# Patient Record
Sex: Female | Born: 1940 | Race: White | Hispanic: No | Smoking: Never smoker
Health system: Southern US, Community
[De-identification: ages and names within clinical notes are randomized; demographics above are authoritative.]

## PROBLEM LIST (undated history)

## (undated) DIAGNOSIS — K429 Umbilical hernia without obstruction or gangrene: Secondary | ICD-10-CM

## (undated) DIAGNOSIS — M1990 Unspecified osteoarthritis, unspecified site: Secondary | ICD-10-CM

## (undated) DIAGNOSIS — T7840XA Allergy, unspecified, initial encounter: Secondary | ICD-10-CM

## (undated) DIAGNOSIS — K589 Irritable bowel syndrome without diarrhea: Secondary | ICD-10-CM

## (undated) DIAGNOSIS — K219 Gastro-esophageal reflux disease without esophagitis: Secondary | ICD-10-CM

## (undated) DIAGNOSIS — E785 Hyperlipidemia, unspecified: Secondary | ICD-10-CM

## (undated) DIAGNOSIS — Z9989 Dependence on other enabling machines and devices: Secondary | ICD-10-CM

## (undated) DIAGNOSIS — K5790 Diverticulosis of intestine, part unspecified, without perforation or abscess without bleeding: Secondary | ICD-10-CM

## (undated) DIAGNOSIS — D649 Anemia, unspecified: Secondary | ICD-10-CM

## (undated) DIAGNOSIS — G4733 Obstructive sleep apnea (adult) (pediatric): Secondary | ICD-10-CM

## (undated) DIAGNOSIS — I639 Cerebral infarction, unspecified: Secondary | ICD-10-CM

## (undated) DIAGNOSIS — E119 Type 2 diabetes mellitus without complications: Secondary | ICD-10-CM

## (undated) DIAGNOSIS — D369 Benign neoplasm, unspecified site: Secondary | ICD-10-CM

## (undated) DIAGNOSIS — N200 Calculus of kidney: Secondary | ICD-10-CM

## (undated) DIAGNOSIS — I10 Essential (primary) hypertension: Secondary | ICD-10-CM

## (undated) DIAGNOSIS — G4730 Sleep apnea, unspecified: Secondary | ICD-10-CM

## (undated) DIAGNOSIS — F419 Anxiety disorder, unspecified: Secondary | ICD-10-CM

## (undated) DIAGNOSIS — F329 Major depressive disorder, single episode, unspecified: Secondary | ICD-10-CM

## (undated) DIAGNOSIS — E669 Obesity, unspecified: Secondary | ICD-10-CM

## (undated) HISTORY — DX: Obesity, unspecified: E66.9

## (undated) HISTORY — DX: Benign neoplasm, unspecified site: D36.9

## (undated) HISTORY — DX: Umbilical hernia without obstruction or gangrene: K42.9

## (undated) HISTORY — DX: Unspecified osteoarthritis, unspecified site: M19.90

## (undated) HISTORY — DX: Anemia, unspecified: D64.9

## (undated) HISTORY — PX: CYSTOCELE REPAIR: SHX163

## (undated) HISTORY — DX: Gastro-esophageal reflux disease without esophagitis: K21.9

## (undated) HISTORY — PX: UMBILICAL HERNIA REPAIR: SHX196

## (undated) HISTORY — DX: Major depressive disorder, single episode, unspecified: F32.9

## (undated) HISTORY — PX: RECTOCELE REPAIR: SHX761

## (undated) HISTORY — PX: KNEE SURGERY: SHX244

## (undated) HISTORY — PX: TOTAL ABDOMINAL HYSTERECTOMY: SHX209

## (undated) HISTORY — DX: Calculus of kidney: N20.0

## (undated) HISTORY — DX: Essential (primary) hypertension: I10

## (undated) HISTORY — DX: Hyperlipidemia, unspecified: E78.5

## (undated) HISTORY — DX: Irritable bowel syndrome, unspecified: K58.9

## (undated) HISTORY — DX: Dependence on other enabling machines and devices: Z99.89

## (undated) HISTORY — DX: Sleep apnea, unspecified: G47.30

## (undated) HISTORY — PX: APPENDECTOMY: SHX54

## (undated) HISTORY — DX: Obstructive sleep apnea (adult) (pediatric): G47.33

## (undated) HISTORY — PX: OVARIAN CYST REMOVAL: SHX89

## (undated) HISTORY — PX: TONSILLECTOMY AND ADENOIDECTOMY: SUR1326

## (undated) HISTORY — PX: CHOLECYSTECTOMY: SHX55

## (undated) HISTORY — PX: ESOPHAGOGASTRODUODENOSCOPY: SHX1529

## (undated) HISTORY — DX: Cerebral infarction, unspecified: I63.9

## (undated) HISTORY — DX: Anxiety disorder, unspecified: F41.9

## (undated) HISTORY — DX: Type 2 diabetes mellitus without complications: E11.9

## (undated) HISTORY — DX: Allergy, unspecified, initial encounter: T78.40XA

## (undated) HISTORY — DX: Diverticulosis of intestine, part unspecified, without perforation or abscess without bleeding: K57.90

---

## 2002-08-04 ENCOUNTER — Encounter: Payer: Self-pay | Admitting: Internal Medicine

## 2002-08-04 ENCOUNTER — Encounter: Admission: RE | Admit: 2002-08-04 | Discharge: 2002-08-04 | Payer: Self-pay | Admitting: Internal Medicine

## 2005-03-12 ENCOUNTER — Ambulatory Visit (HOSPITAL_BASED_OUTPATIENT_CLINIC_OR_DEPARTMENT_OTHER): Admission: RE | Admit: 2005-03-12 | Discharge: 2005-03-12 | Payer: Self-pay | Admitting: Orthopedic Surgery

## 2005-03-12 ENCOUNTER — Ambulatory Visit (HOSPITAL_COMMUNITY): Admission: RE | Admit: 2005-03-12 | Discharge: 2005-03-12 | Payer: Self-pay | Admitting: Orthopedic Surgery

## 2005-04-01 ENCOUNTER — Ambulatory Visit (HOSPITAL_COMMUNITY): Admission: RE | Admit: 2005-04-01 | Discharge: 2005-04-01 | Payer: Self-pay | Admitting: Orthopedic Surgery

## 2005-11-18 ENCOUNTER — Inpatient Hospital Stay (HOSPITAL_COMMUNITY): Admission: AD | Admit: 2005-11-18 | Discharge: 2005-11-20 | Payer: Self-pay | Admitting: Cardiology

## 2005-11-18 ENCOUNTER — Ambulatory Visit: Payer: Self-pay | Admitting: Cardiology

## 2005-11-27 ENCOUNTER — Ambulatory Visit: Payer: Self-pay | Admitting: Cardiology

## 2005-12-03 ENCOUNTER — Ambulatory Visit: Payer: Self-pay | Admitting: Internal Medicine

## 2005-12-15 ENCOUNTER — Ambulatory Visit: Payer: Self-pay | Admitting: Cardiology

## 2006-03-09 ENCOUNTER — Ambulatory Visit: Payer: Self-pay | Admitting: Internal Medicine

## 2006-05-23 ENCOUNTER — Emergency Department (HOSPITAL_COMMUNITY): Admission: EM | Admit: 2006-05-23 | Discharge: 2006-05-23 | Payer: Self-pay | Admitting: Emergency Medicine

## 2007-07-15 ENCOUNTER — Emergency Department (HOSPITAL_COMMUNITY): Admission: EM | Admit: 2007-07-15 | Discharge: 2007-07-16 | Payer: Self-pay | Admitting: Emergency Medicine

## 2008-10-30 ENCOUNTER — Ambulatory Visit (HOSPITAL_COMMUNITY): Admission: RE | Admit: 2008-10-30 | Discharge: 2008-10-30 | Payer: Self-pay | Admitting: Family Medicine

## 2010-02-04 ENCOUNTER — Encounter (INDEPENDENT_AMBULATORY_CARE_PROVIDER_SITE_OTHER): Payer: Self-pay | Admitting: *Deleted

## 2010-02-28 ENCOUNTER — Telehealth: Payer: Self-pay | Admitting: Internal Medicine

## 2010-03-03 ENCOUNTER — Encounter (INDEPENDENT_AMBULATORY_CARE_PROVIDER_SITE_OTHER): Payer: Self-pay | Admitting: *Deleted

## 2010-03-05 ENCOUNTER — Ambulatory Visit: Payer: Self-pay | Admitting: Internal Medicine

## 2010-03-10 ENCOUNTER — Encounter: Payer: Self-pay | Admitting: Internal Medicine

## 2010-03-19 ENCOUNTER — Ambulatory Visit: Payer: Self-pay | Admitting: Internal Medicine

## 2010-03-20 ENCOUNTER — Encounter: Payer: Self-pay | Admitting: Internal Medicine

## 2010-06-17 NOTE — Letter (Signed)
Summary: Patient Notice- Polyp Results  [facility]  [address] [place], [place] [address]   Phone: [phone]  Fax: [phone]        March 20, 2010 MRN: [id number]    [facility] [address] [place], [place]  [address]    Dear [name],  I am pleased to inform you that the colon polyp(s) removed during your recent colonoscopy was (were) found to be benign (no cancer detected) upon pathologic examination.The polyps were tubular adenomas ( precancerous)  I recommend you have a repeat colonoscopy examination in _5 years to look for recurrent polyps, as having colon polyps increases your risk for having recurrent polyps or even colon cancer in the future.  Should you develop new or worsening symptoms of abdominal pain, bowel habit changes or bleeding from the rectum or bowels, please schedule an evaluation with either your primary care physician or with me.  Additional information/recommendations:  _x_ No further action with gastroenterology is needed at this time. Please      follow-up with your primary care physician for your other healthcare      needs.  __ Please call [phone] to schedule a return visit to review your      situation.  __ Please keep your follow-up visit as already scheduled.  __ Continue treatment plan as outlined the day of your exam.  Please call us if you are having persistent problems or have questions about your condition that have not been fully answered at this time.  Sincerely,  Hart Carwin MD  This letter has been electronically signed by your physician.  Appended Document: Patient Notice- Polyp Results letter mailed

## 2010-06-17 NOTE — Miscellaneous (Signed)
Summary: DIR COL...EM/previst  Clinical Lists Changes  Medications: Added new medication of MIRALAX   POWD (POLYETHYLENE GLYCOL 3350) As directed - Signed Added new medication of REGLAN 10 MG  TABS (METOCLOPRAMIDE HCL) As directed - Signed Added new medication of DULCOLAX 5 MG  TBEC (BISACODYL) As directed - Signed Rx of MIRALAX   POWD (POLYETHYLENE GLYCOL 3350) As directed;  #255 gms x 0;  Signed;  Entered by: Clide Cliff RN;  Authorized by: Hart Carwin MD;  Method used: Electronically to [facility]. [phone]*, [address] [place], [place], [place]  [address], Ph: [phone] or [phone], Fax: [phone] Rx of REGLAN 10 MG  TABS (METOCLOPRAMIDE HCL) As directed;  #2 x 0;  Signed;  Entered by: Clide Cliff RN;  Authorized by: Hart Carwin MD;  Method used: Electronically to [facility]. [phone]*, [address] [place], [place], [place]  [address], Ph: [phone] or [phone], Fax: [phone] Rx of DULCOLAX 5 MG  TBEC (BISACODYL) As directed;  #4 x 0;  Signed;  Entered by: Clide Cliff RN;  Authorized by: Hart Carwin MD;  Method used: Electronically to [facility]. [phone]*, [address], [place], [place], [place]  [address], Ph: [phone] or [phone], Fax: [phone]    Prescriptions: DULCOLAX 5 MG  TBEC (BISACODYL) As directed  #4 x 0   Entered by:   Clide Cliff RN   Authorized by:   Hart Carwin MD   Signed by:   Clide Cliff RN on 03/05/2010   Method used:   Electronically to        [facility]. [phone]* (retail)       [address]       [place], [place]  [address]       Ph: [phone] or [phone]       Fax: [phone]   RxID:   [id number] REGLAN 10 MG  TABS (METOCLOPRAMIDE HCL) As directed  #2 x 0   Entered by:   Clide Cliff RN   Authorized by:   Hart Carwin MD   Signed by:   Clide Cliff RN on 03/05/2010   Method used:    Electronically to        [facility]. [phone]* (retail)       [address]       [place], [place]  [address]       Ph: [phone] or [phone]       Fax: [phone]   RxID:   [id number] MIRALAX   POWD (POLYETHYLENE GLYCOL 3350) As directed  #255 gms x 0   Entered by:   Clide Cliff RN   Authorized by:   Hart Carwin MD   Signed by:   Clide Cliff RN on 03/05/2010   Method used:   Electronically to        [facility]. [phone]* (retail)       [address]       [place], [place]  [address]       Ph: [phone] or [phone]       Fax: [phone]   RxID:   [id number]

## 2010-06-17 NOTE — Letter (Signed)
Summary: Pre Visit Letter Revised  [facility]  [address] [place], [place] [address]   Phone: [phone]  Fax: [phone]        02/04/2010 MRN: [id number]  [facility] [address] [place], [place]  [address]             Procedure Date:  March 19, 2010   Welcome to the Gastroenterology Division at [facility].    You are scheduled to see a nurse for your pre-procedure visit on March 05, 2010 at 10:00am on the 3rd floor at [facility], [address]  We ask that you try to arrive at our office 15 minutes prior to your appointment time to allow for check-in.  Please take a minute to review the attached form.  If you answer "Yes" to one or more of the questions on the first page, we ask that you call the person listed at your earliest opportunity.  If you answer "No" to all of the questions, please complete the rest of the form and bring it to your appointment.    Your nurse visit will consist of discussing your medical and surgical history, your immediate family medical history, and your medications.   If you are unable to list all of your medications on the form, please bring the medication bottles to your appointment and we will list them.  We will need to be aware of both prescribed and over the counter drugs.  We will need to know exact dosage information as well.    Please be prepared to read and sign documents such as consent forms, a financial agreement, and acknowledgement forms.  If necessary, and with your consent, a friend or relative is welcome to sit-in on the nurse visit with you.  Please bring your insurance card so that we may make a copy of it.  If your insurance requires a referral to see a specialist, please bring your referral form from your primary care physician.  No co-pay is required for this nurse visit.     If you cannot keep your appointment, please call [phone] to cancel or reschedule prior to your appointment date.   This allows [place] the opportunity to schedule an appointment for another patient in need of care.    Thank you for choosing  Gastroenterology for your medical needs.  We appreciate the opportunity to care for you.  Please visit [place] at our website  to learn more about our practice.  Sincerely, The Gastroenterology Division

## 2010-06-17 NOTE — Letter (Signed)
Summary: [facility] Instructions  [facility]  [address] [place], [place] [address]   Phone: [phone]  Fax: [phone]       Wendy Fitzpatrick    1941-04-24    MRN: [id number]       Procedure Day Dorna Bloom:  Wednesday 03/19/2010     Arrival Time: 9:30 am     Procedure Time: 10:30 am     Location of Procedure:                    _x _  [facility] (4th Floor)    PREPARATION FOR COLONOSCOPY WITH MIRALAX  Starting 5 days prior to your procedure Friday 10/28 do not eat nuts, seeds, popcorn, corn, beans, peas,  salads, or any raw vegetables.  Do not take any fiber supplements (e.g. Metamucil, Citrucel, and Benefiber). ____________________________________________________________________________________________________   THE DAY BEFORE YOUR PROCEDURE         DATE: Tuesday 11/1  1   Drink clear liquids the entire day-NO SOLID FOOD  2   Do not drink anything colored red or purple.  Avoid juices with pulp.  No orange juice.  3   Drink at least 64 oz. (8 glasses) of fluid/clear liquids during the day to prevent dehydration and help the prep work efficiently.  CLEAR LIQUIDS INCLUDE: Water Jello Ice Popsicles Tea (sugar ok, no milk/cream) Powdered fruit flavored drinks Coffee (sugar ok, no milk/cream) Gatorade Juice: apple, white grape, white cranberry  Lemonade Clear bullion, consomm, broth Carbonated beverages (any kind) Strained chicken noodle soup Hard Candy  4   Mix the entire bottle of Miralax with 64 oz. of Gatorade/Powerade in the morning and put in the refrigerator to chill.  5   At 3:00 pm take 2 Dulcolax/Bisacodyl tablets.  6   At 4:30 pm take one Reglan/Metoclopramide tablet.  7  Starting at 5:00 pm drink one 8 oz glass of the Miralax mixture every 15-20 minutes until you have finished drinking the entire 64 oz.  You should finish drinking prep around 7:30 or 8:00 pm.  8   If you are nauseated, you may take the 2nd Reglan/Metoclopramide  tablet at 6:30 pm.        9    At 8:00 pm take 2 more DULCOLAX/Bisacodyl tablets.     THE DAY OF YOUR PROCEDURE      DATE:  Wednesday 11/2  You may drink clear liquids until 8:30 am  (2 HOURS BEFORE PROCEDURE).   MEDICATION INSTRUCTIONS  Unless otherwise instructed, you should take regular prescription medications with a small sip of water as early as possible the morning of your procedure.           OTHER INSTRUCTIONS  You will need a responsible adult at least 68 years of age to accompany you and drive you home.   This person must remain in the waiting room during your procedure.  Wear loose fitting clothing that is easily removed.  Leave jewelry and other valuables at home.  However, you may wish to bring a book to read or an iPod/MP3 player to listen to music as you wait for your procedure to start.  Remove all body piercing jewelry and leave at home.  Total time from sign-in until discharge is approximately 2-3 hours.  You should go home directly after your procedure and rest.  You can resume normal activities the day after your procedure.  The day of your procedure you should not:   Drive  Make legal decisions   Operate machinery   Drink alcohol   Return to work  You will receive specific instructions about eating, activities and medications before you leave.   The above instructions have been reviewed and explained to me by   Clide Cliff, RN______________________    I fully understand and can verbalize these instructions _____________________________ Date _______

## 2010-06-17 NOTE — Progress Notes (Signed)
Summary: Speak to Wendy Fitzpatrick  Phone Note Call from Patient Call back at [phone] cell   Call For: Dr Juanda Chance Reason for Call: Talk to Nurse Summary of Call: Wants to discuss the Assesment form we mailed her one of the questions is a yes. Initial call taken by: Leanor Kail [facility],  February 28, 2010 1:34 PM  Follow-up for Phone Call        Patient states that she had a colonoscopy in [place] around 12 years ago. I have sent a release of information form for patient to sign so we can at least attempt to get those records. Follow-up by: Lamona Curl CMA Duncan Dull),  February 28, 2010 2:51 PM

## 2010-06-17 NOTE — Procedures (Signed)
Summary: Colonoscopy  Patient: [name] Note: All result statuses are Final unless otherwise noted.  Tests: (1) Colonoscopy (COL)   COL Colonoscopy           DONE     [facility]     [address]     [place], [place]  [address]           COLONOSCOPY PROCEDURE REPORT           PATIENT:  Wendy Fitzpatrick, Wendy Fitzpatrick  MR#:  [id number]     BIRTHDATE:  1940-08-26, 69 yrs. old  GENDER:  female     ENDOSCOPIST:  Hedwig Morton. Juanda Chance, MD     REF. BY:  Kari Baars, M.D.     PROCEDURE DATE:  03/19/2010     PROCEDURE:  Colonoscopy [address]     ASA CLASS:  Class II     INDICATIONS:  Elevated Risk Screening father with colon cancer     MEDICATIONS:   Versed 5 mg, Fentanyl 25 mcg           DESCRIPTION OF PROCEDURE:   After the risks benefits and     alternatives of the procedure were thoroughly explained, informed     consent was obtained.  Digital rectal exam was performed and     revealed no rectal masses.   The LB [id number]-[id number] endoscope     was introduced through the anus and advanced to the cecum, which     was identified by both the appendix and ileocecal valve, without     limitations.  The quality of the prep was adequate, using MiraLax.     The instrument was then slowly withdrawn as the colon was fully     examined.     <<PROCEDUREIMAGES>>           FINDINGS:  Two polyps were found. at 50 and 55 cm 1-2 mm polyps     removed The polyps were removed using cold biopsy forceps (see     image7, image6, and image9).  Mild diverticulosis was found in the     sigmoid colon (see image10 and image1).  This was otherwise a     normal examination of the colon (see image2, image3, image4,     image5, and image11).   Retroflexed views in the rectum revealed     no abnormalities.    The scope was then withdrawn from the patient     and the procedure completed.           COMPLICATIONS:  None     ENDOSCOPIC IMPRESSION:     1) Two polyps     2) Mild diverticulosis in the sigmoid colon     3)  Otherwise normal examination     RECOMMENDATIONS:     1) Await pathology results     2) High fiber diet.     REPEAT EXAM:  In 5 - 7 year(s) for.           ______________________________     Hedwig Morton. Juanda Chance, MD           CC:           n.     eSIGNED:   Hedwig Morton. Brodie at 03/19/2010 11:21 AM           Neta Mends, [id number]  Note: An exclamation mark (!) indicates a result that was not dispersed into the flowsheet. Document Creation Date: 03/19/2010 11:22 AM _______________________________________________________________________  (1) Order  result status: Final Collection or observation date-time: 03/19/2010 11:16 Requested date-time:  Receipt date-time:  Reported date-time:  Referring Physician:   Ordering Physician: Lina Sar [phone]) Specimen Source:  Source: Launa Grill Order Number: [id number] Lab site:   Appended Document: Colonoscopy     Procedures Next Due Date:    Colonoscopy: 03/2015

## 2010-06-17 NOTE — Miscellaneous (Signed)
Summary: Previous Colonoscopy  Patient advised [place] that she had a colonoscopy 12 years ago in [place]. I sent her a records release to sign and send back to our office. Unfortunately, she states that she is unable to recall where in [place] her procedure was. Therefore, there are no available records from patient's previous colonoscopy. Dottie Nelson-Smith CMA Duncan Dull)  March 10, 2010 9:47 AM

## 2010-08-11 ENCOUNTER — Encounter: Payer: Self-pay | Admitting: Internal Medicine

## 2010-08-19 NOTE — Letter (Signed)
Summary: New Patient letter  [facility]  [address] [place], [place] [address]   Phone: [phone]  Fax: [phone]       08/11/2010 MRN: [id number]  [facility] [address] [place], [place]  [address]  Dear [name],  Welcome to the Gastroenterology Division at [facility].    You are scheduled to see Dr.  Juanda Chance on 10-07-10 at 10:30am on the 3rd floor at [facility], [address].  We ask that you try to arrive at our office 15 minutes prior to your appointment time to allow for check-in.  We would like you to complete the enclosed self-administered evaluation form prior to your visit and bring it with you on the day of your appointment.  We will review it with you.  Also, please bring a complete list of all your medications or, if you prefer, bring the medication bottles and we will list them.  Please bring your insurance card so that we may make a copy of it.  If your insurance requires a referral to see a specialist, please bring your referral form from your primary care physician.  Co-payments are due at the time of your visit and may be paid by cash, check or credit card.     Your office visit will consist of a consult with your physician (includes a physical exam), any laboratory testing he/she may order, scheduling of any necessary diagnostic testing (e.g. x-ray, ultrasound, CT-scan), and scheduling of a procedure (e.g. Endoscopy, Colonoscopy) if required.  Please allow enough time on your schedule to allow for any/all of these possibilities.    If you cannot keep your appointment, please call [phone] to cancel or reschedule prior to your appointment date.  This allows [place] the opportunity to schedule an appointment for another patient in need of care.  If you do not cancel or reschedule by 5 p.m. the business day prior to your appointment date, you will be charged a $50.00 late cancellation/no-show fee.    Thank you for choosing [facility] for your medical needs.  We appreciate the opportunity to care for you.  Please visit [place] at our website  to learn more about our practice.                     Sincerely,                                                             The Gastroenterology Division

## 2010-10-03 NOTE — Cardiovascular Report (Signed)
NAME:  Wendy Fitzpatrick, Wendy Fitzpatrick                  ACCOUNT NO.:  [id number]   MEDICAL RECORD NO.:  [id number]          PATIENT TYPE:  INP   LOCATION:  2920                         FACILITY:  [facility]   PHYSICIAN:  Arvilla Meres, M.D. [facility] OF BIRTH:  1940/07/29   DATE OF PROCEDURE:  11/19/2005  DATE OF DISCHARGE:                              CARDIAC CATHETERIZATION   PRIMARY CARE PHYSICIAN:  Dr. Ruffin Pyo   CARDIOLOGIST:  She is new to [facility].   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  She  has had known vasovagal syncope in the past.  Recently she has had three  episodes of syncope as well as some chest discomfort.  She underwent  Cardiolite scanning with at [facility] with a question of an apical  defect and she is sent for cardiac catheterization.   PROCEDURES PERFORMED:  1.  Selective coronary angiography.  2.  Left heart catheterization.  3.  Left ventriculogram.  4.  Abdominal aortogram.  5.  Angio-Seal closure.   DESCRIPTION OF PROCEDURE:  The risks and benefits of catheterization were  explained.  Consent was signed and placed on the chart.  Under 1% local  lidocaine a 5-French arterial sheath was placed in the right femoral artery  using a modified Seldinger technique.  Standard catheters including a JL-  3.5, JR-4, and angled pigtail were used for procedure.  At the end of the  procedure the patient's right femoral arteriotomy site was closed using  Angio-Seal closure device.  There was good hemostasis.  The are no apparent  complications.  Central aortic pressure was 164/85 with a mean of 117.  LV  pressure was 192/80 with an LVEDP of 24.  There is no aortic stenosis.   Left main was angiographically normal.   LAD was a long vessel coursing to the apex.  It gave off two small diagonals  and was angiographically normal.   Left circumflex was a large  dominant vessel.  It gave off a small ramus, a  large OM-1, a small OM-2, a moderate-size OM-3, moderate-size posterolateral  and a moderate-size PDA.  It was angiographically normal.   The right coronary was a small nondominant vessel.   Left ventriculogram done in the RAO projection showed an EF of 65-70% with  trace mitral regurgitation.  No wall motion abnormalities.   ASSESSMENT:  1.  Normal coronary arteries.  2.  Normal left ventricular function with mildly elevated end diastolic      pressure, consistent with diastolic dysfunction.  3.  Poorly controlled hypertension.   Plan will be aggressive control of her blood pressure, trial of proton pump  inhibitor for her chest pain.  She can go home in the morning if her groin  remains stable.      Arvilla Meres, M.D. [facility]  Electronically Signed     DB/MEDQ  D:  11/19/2005  T:  11/19/2005  Job:  [id number]

## 2010-10-03 NOTE — Discharge Summary (Signed)
NAME:  Wendy Fitzpatrick, Wendy Fitzpatrick                  ACCOUNT NO.:  [id number]   MEDICAL RECORD NO.:  [id number]          PATIENT TYPE:  INP   LOCATION:  2920                         FACILITY:  [facility]   PHYSICIAN:  Jesse Sans. Wall, M.D.   DATE OF BIRTH:  07-Jul-1940   DATE OF ADMISSION:  11/18/2005  DATE OF DISCHARGE:  11/20/2005                                 DISCHARGE SUMMARY   REASON FOR ADMISSION:  Chest pain worrisome for unstable angina pectoris.   DISCHARGE DIAGNOSES:  1.  Noncardiac chest pain.      1.  Normal coronary arteries per catheterization this admission.  2.  Good left ventricular function.  3.  History of vasovagal syncope.      1.  MRI/MRA of the brain normal this admission.      2.  No significant internal carotid artery stenosis noted on a carotid          Doppler done at [facility].      3.  Good left ventricular function on echocardiogram performed at          [facility].  4.  Hypertension.  5.  Gastroesophageal reflux disease.  6.  Mild dyslipidemia.  7.  Degenerative joint disease.  8.  Obesity.  9.  Migraine headaches.  10. Depression/anxiety.  11. Allergic rhinitis.  12. Mild orthostasis.   PROCEDURE PERFORMED THIS ADMISSION:  Cardiac catheterization by Dr. Arvilla Meres, revealing normal coronary arteries, good LV function.   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  She ruled out for myocardial infarction,  but underwent a nuclear study that was concerning for apical ischemia.  She  was transferred for further evaluation.  Her history is notable for an  episode that was worrisome for a transient ischemic attack.  This was  witnessed by her daughter, who is a family physician.  The patient underwent  carotid Dopplers, as well as an echocardiogram, at [facility].  The  results were negative as noted above.   The patient also has a history of  vasovagal syncope in the past.  She has apparently been evaluated.  She has  been having symptoms since she was 70 years old.  She did have an episode of  syncope while she was getting into the ambulance and preparing for transfer  to [facility].   HOSPITAL COURSE:  The patient was accepted in transferred at [facility].  She was continued on aspirin, Lovenox, beta blocker, and  nitroglycerin, and a statin was added empirically.  She was set up for a  cardiac catheterization November 19, 2005.  Cardiac catheterization was done by  Dr. Gala Romney and revealed normal coronary arteries and good LV function and  trace mitral regurgitation.  The EF was 65-70%.  It was recommended that she  begin a trial of proton pump inhibitor.   Given the patient's history of vasovagal syncope and a recent episode  concerning  for transient ischemic attack, we set her up for an MRI/MRA of  her brain.  This returned normal.  As noted above, her carotid Dopplers were  also negative for hemodynamically significant internal carotid artery  stenosis.  The patient underwent orthostatic vital signs prior to discharge.  Her blood pressure, lying, was 124/70 with a pulse of 73, sitting 126/65  with a pulse of 76, standing 111/74 with a pulse of 97.   The patient was seen by Dr. Daleen Squibb on November 20, 2005, and she was felt stable  enough for discharge to home.  Given her history of vasovagal syncope, we  can consider outpatient workup.  This can be discussed when she returns to  the office for followup.  She apparently had a tilt table test about 15  years ago.  Given her mild orthostasis as evidenced by her increased heart  rate with standing, she will be asked to increase her p.o. fluids.  We can  recheck this when she returns to the office for followup.   LABS AND ANCILLARY DATA:  Echocardiogram from [facility] revealed a  minimally sclerotic aortic valve, mild  tricuspid regurgitation without  pulmonary hypertension, normal LV systolic performance.  Carotid Dopplers  revealed no evidence of hemodynamically significant stenosis involving the  carotid circulation bilaterally.  There was some mild intimal thickening and  small soft and calcified plaque at the origin of the right ICA and mild  intimal thickening involving the common carotid artery on the left with a  small soft and calcified plaque at the origin of the left external carotid  artery.  MRI/MRA of the brain:  Negative MRI of the brain.  No evidence of  acute or subacute infarction.  Negative MRA of the circle of Willis.  White  count 6100, hemoglobin 12.9, hematocrit 38.1, platelet count 177,000.  INR  1.0.  Sodium 141, potassium 4.1, glucose 98, BUN 11, creatinine 0.8, calcium  8.9.  LFTs okay.  Total protein 5.4, albumin 3.3.  Cardiac markers negative  x2.  TSH 2.604.   DISCHARGE MEDICATIONS:  1.  Wellbutrin 300 mg daily.  2.  Lexapro 20 mg daily.  3.  Atenolol 25 mg daily.  4.  Lotensin 10 mg daily.  5.  Protonix 40 mg daily.  6.  Fioricet.  7.  Glucosamine.  8.  Vitamin D.  9.  Coated aspirin 81 mg daily.   DIET:  Low-fat, low-sodium, low-cholesterol diet.  She has been asked to  increase her fluids to help with her mild orthostasis.  She has been  instructed on hygiene from changing from lying to sitting to standing to  prevent dizziness.   ACTIVITY:  No driving, heavy lifting, or sexual activity for the next 2  days.  She may shower and walk up steps, and she is to increase her activity  slowly.   WOUND CARE:  She should call our office for any groin swelling, bleeding or  bruising, or fever.   FOLLOWUP:  1.  She will see the PA at [facility] for Dr. Gala Romney on December 15, 2005, at 9:45 a.m.  At that time, it can be discussed with the patient     regarding outpatient workup for her vasovagal syncope.  We should also      recheck her orthostatic vital  signs.  2.  She should see her primary care physician, Dr. Simone Curia, in the next 2-      3 weeks  for a followup appointment.  She can undergo a trial of      lifestyle modifications to assist her lipid panel.  She can further      discuss the initiation of a statin with her primary care physician      should she not attain an LDL level below 100.      Tereso Newcomer, P.A.      Thomas C. Wall, M.D.  Electronically Signed    SW/MEDQ  D:  11/20/2005  T:  11/20/2005  Job:  [id number]   cc:   Simone Curia

## 2010-10-03 NOTE — Assessment & Plan Note (Signed)
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. [employer] who has a  history of hypertension and hyperlipidemia and recently had a normal cardiac  catheterization.  She continues to have problems with blood pressure,  palpitations and dizziness.  She has an appointment to see a neurologist for  possible TIA work-up.  She was seen by Dr. Antoine Poche here in the office two  weeks ago and had 24-hour urines for catecholamines, metanephrines and VMA  to rule out pheochromocytoma, all of which were normal.  She does have an  event recorder that she is wearing and she has had several episodes of  palpitations and sinus tachycardia up to 126 beats per minute.  These all  occurred when her blood pressure was elevated and she did not necessarily  take her medications.  Her most significant episode was when she was cooking  dinner for her husband's birthday, she had forgotten to take her medicine  and she felt dizzy and her heart started racing.  When she took her blood  pressure, it was 172/92.  As soon as she took her medications, she felt  better.  Since the benazepril has been stopped, she is feeling much better  overall.  This was causing her blood pressure to dip as low as 85/40.   CURRENT MEDICATIONS:  1.  Atenolol 25 daily.  2.  Protonix 40 daily.  3.  Lipitor 40 nightly.  4.  Lexapro 20 daily.  5.  Wellbutrin 300 daily.  6.  Glucosamine chondroitan.  7.  Vitamin D and calcium daily.  8.  Aspirin 81 mg daily.  9.  Topamax 100 mg nightly.   PHYSICAL EXAMINATION:  GENERAL APPEARANCE:  This is a pleasant 70 year old  white female in no acute distress.  VITAL SIGNS:  Blood pressure 128/80, pulse 82, weight 201.  NECK:  Without JVD, HJR, bruit  or thyroid enlargement.  LUNGS:  Clear anterior, posterior and lateral.  CARDIOVASCULAR:  Regular rate and rhythm at 82 beats per minute.  Normal S1  and S2.  Distant heart sounds are heard throughout.  ABDOMEN:  Soft without organomegaly, masses, lesions or abnormal tenderness.  EXTREMITIES:  Without clubbing, cyanosis, or edema.  She has good distal  pulses.   IMPRESSION:  1.  Hypertension.  Needs better 24-hour control.  2.  Palpitations and sinus tachycardia related to her hypertension.  3.  Normal coronary arteries on catheterization in July 2007 with normal      left ventricular function.  4.  Question of transient ischemic attack for neurology work-up.  5.  Treated dyslipidemia.  6.  Anxiety and depression.  7.  Gastroesophageal reflux disease.  8.  Obesity.  9.  History of migraine headaches.   PLAN:  Will increase her Atenolol to 50 mg a day.  We  have asked her to take  this consistently at the same time every morning to avoid high fluctuations  in her blood pressure.  Dr. Gala Romney talked to her about stress reduction  and decreased caffeine intake.  He will see her back in three months' time.                                  Jacolyn Reedy, PA-C    ML/MedQ  DD:  12/15/2005  DT:  12/15/2005  Job #:  [id number]

## 2010-10-03 NOTE — Discharge Summary (Signed)
NAME:  Wendy Fitzpatrick, Wendy Fitzpatrick                  ACCOUNT NO.:  [id number]   MEDICAL RECORD NO.:  [id number]          PATIENT TYPE:  INP   LOCATION:  2920                         FACILITY:  [facility]   PHYSICIAN:  Jesse Sans. Wall, M.D.   DATE OF BIRTH:  [birth year]   DATE OF ADMISSION:  11/18/2005  DATE OF DISCHARGE:                                 DISCHARGE SUMMARY   ADDENDUM:  Total physician/PA time greater than 30 minutes on this  discharge.      Tereso Newcomer, P.A.      Thomas C. Wall, M.D.  Electronically Signed    SW/MEDQ  D:  11/20/2005  T:  11/20/2005  Job:  [id number]

## 2010-10-03 NOTE — Assessment & Plan Note (Signed)
HEALTHCARE                              CARDIOLOGY OFFICE NOTE   NAME:Fitzpatrick, Wendy                           MRN:          [id number]  DATE:03/09/2006                            DOB:          November 06, 1940    PRIMARY CARE PHYSICIAN:  Dr. 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   PROBLEM LIST:  1. Hypertension.      a.     Work up for pheochromocytoma was negative.  2. Palpitations and sinus tachycardia related to her hypertension.  3. Chest pain.      a.     Normal coronary arteries on catheterization in July 2007, with       normal LV function. Questionable history of transient ischemic attack;       workup per neurology, MRI/MRA and carotid Dopplers negative.  4. Obesity.  5. Hyperlipidemia.  6. Anxiety and depression.  7. Gastroesophageal reflux disease.   CURRENT MEDICATIONS:  1. Atenolol 50 a day.  2. Protonix 40 a day.  3. Lipitor 40 a day.  4. Lexapro 20.  5. Wellbutrin 300.  6. Glucosamine.  7. Aspirin 81.  8. Topamax 1000.  9. Lisinopril 20 a day.   INTERVAL HISTORY:  Wendy Fitzpatrick recently recovered from stomach flu. She now  feels better. She denies any chest pains. She says palpitations have been  much better. She denies any heart failure symptoms. She does feel  significant sense of anxiety, especially as her husband has early onset  dementia.   REVIEW OF SYSTEMS:  She also tells me that she has frequent snoring, but  does not endorse significant nighttime sleepiness as she does work night  shift.   PHYSICAL EXAMINATION:  She is well-appearing in no acute distress.  Respirations are unlabored. Blood pressure is 134/76. Heart rate is 53.  Weight is 194.  HEENT: Sclerae anicteric. EOMI. There is no xanthelasma. Mucous membranes  are moist.  Neck is supple. There is no JVD. Neck is thick. Carotids are 2+ bilaterally  without any bruits. There is no lymphadenopathy or thyromegaly.  CARDIAC: She is bradycardic and regular. No obvious murmurs, rubs or  gallops.  LUNGS:  Are clear.  ABDOMEN: Obese, nontender, nondistended. No hepatosplenomegaly. No bruits.  No masses.  EXTREMITIES: Are warm with no cyanosis, clubbing or edema. Good distal  pulses.  NEURO: She is alert and oriented x3. Cranial nerves II-XII are intact. Moves  all four extremities without difficulty. Affect is bright.   EKG shows sinus bradycardia at a rate of 53 with normal axis and intervals.   ASSESSMENT/PLAN:  1. Hypertension, this is suboptimally controlled. She was recently started      on lisinopril. We will check a BMET today. We will also switch her      atenolol over to Coreg 6.25 b.i.d. as we will get a significant      component of alpha blockade in addition to beta blockade.  2. Hyperlipidemia, this is followed by  Dr. Nedra Hai.  3. Questionable history of sleep apnea. We will check an apnea link.  4. Anxiety. I have given her a prescription for a one month supply of      Klonopin 0.25 a day and I have asked her to follow up with Dr. Nedra Hai for      further evaluation of this.     Bevelyn Buckles. Bensimhon, MD    DRB/MedQ  DD: 03/09/2006  DT: 03/09/2006  Job #: [id number]   cc:   Dr. Simone Curia

## 2010-10-03 NOTE — Assessment & Plan Note (Signed)
[facility]                              CARDIOLOGY OFFICE NOTE   NAME:Wendy Fitzpatrick, Wendy Fitzpatrick                           MRN:          [id number]  DATE:11/27/2005                            DOB:          [birth year]    SUBJECTIVE:  Wendy Fitzpatrick is a very pleasant 70 year old female patient who  presents to the office today for post hospitalization follow-up.  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  She has recently admitted  in transfer from [facility] to [facility] for chest pain.  She had a cardiac catheterization that was normal.  She had had an episode  that was concerning for a TIA a couple of days prior to her admission to  [facility].  An MRI and MRA of the brain was done with diffusion  weighted images and this was negative.  She also had carotid Dopplers done  at [facility] that showed no significant ICA stenosis.  She had an  echocardiogram that was done that showed good LV function.  She was mildly  orthostatic at discharge and she was instructed on this.  She was instructed  to increase her fluids to help with this.  She also noted a long history of  syncope dating back to age 64.  She had an episode of syncope the night that  she had this possible TIA as well as an episode of syncope when she was  getting into the ambulance to be transferred from [place] to [facility].  The episode when she was getting into the ambulance sounded like  a vagal episode but no strips were recorded.  She continues to note episodes  of lightheadedness, especially when she stands.  She takes her blood  pressure at those times and it is low.  Her blood pressure is high at other  times.  She also notes some headaches as well as diaphoresis and  palpitations.  She denies any chest pain or shortness of breath.  She denies  any frank syncope since discharge from the hospital.   CURRENT MEDICATIONS:  1.  Atenolol 25 mg a day.  2.  Benazepril 10 mg a day.  3.  Protonix 40 mg daily.  4.  Lipitor 40 mg q.h.s.  5.  Lexapro 20 mg daily.  6.  Wellbutrin 300 mg a day.  7.  Glucosamine.  8.  Vitamin D.  9.  Calcium.  10. Topamax 50 mg q.h.s.  11. Aspirin 81 mg daily.  12. Celebrex p.r.n.  13. Ibuprofen p.r.n.  14. Fioricet p.r.n.  15. Vicodin p.r.n.  16. Xanax p.r.n.   ALLERGIES:  No known drug allergies.   PHYSICAL EXAMINATION:  GENERAL:  She is a well-nourished, well-developed  female.  VITAL SIGNS:  Blood pressure 130/92, pulse 91, weight 199 pounds.  HEENT:  Unremarkable.  NECK:  No JVD.  CARDIAC:  S1, S2.  Regular rate and rhythm without murmurs.  LUNGS:  Clear to auscultation bilaterally without wheezing, rhonchi, or  rales.  ABDOMEN:  Soft, nontender.  Normoactive bowel  sounds.  No organomegaly.  EXTREMITIES:  Without edema.  Right groin without hematoma.   Echocardiogram reveals a sinus rhythm with a heart rate of 91, normal axis,  no acute changes.   IMPRESSION:  1.  Lightheadedness - probable orthostasis.  2.  History of syncope.  3.  Normal coronary arteries by catheterization July 2007.  4.  Good left ventricular function.  5.  Questionable transient ischemic attack - negative MRI/MRA of the brain      and no significant stenosis on carotid Dopplers.  6.  Treated hypertension.  7.  Treated dyslipidemia.  8.  Gastroesophageal reflux disease.  9.  Obesity.  10. Migraine headaches.  11. Depression/anxiety.   PLAN:  The patient was also interviewed and examined by Dr. Antoine Poche.  We  plan to start work-up of her lightheadedness with 30-day event monitor.  Will check orthostatics in the office today.  We wanted to take a blood  pressure diary where she checks some blood pressures in the mornings, some  in the afternoons, and some in the evenings.  If there is a great swing in  her blood pressures from morning to evening we may need to change her  medications around to get better 24-hour  coverage than what she has now with  her current medications.  This could be contributing.  A tilt table test  could be considered in the future.  Will put that on hold for now and  discuss it at her follow-up after the above testing is completed.  She also  has had some symptoms of palpitations and headaches and we will set her up  with a 24-hour urine for catecholamines, metanephrines, and VMA to rule out  pheochromocytoma.  We will also refer her to neurology.  The patient herself  as well as her primary care physician and her daughter-in-law who is a  family physician in [place] are all still concerned that she had a  transient ischemic attack as noted above.  Her husband sees Dr. Vickey Huger and  we will refer her to Dr.  Vickey Huger for further evaluation.  Patient will follow up with Dr. Gala Romney  or myself on a day that Dr. Gala Romney is here in the next two to three  weeks.                                  Tereso Newcomer, PA-C    SW/MedQ  DD:  11/27/2005  DT:  11/27/2005  Job #:  [id number]   cc:   Melvyn Novas, MD  Simone Curia

## 2010-10-03 NOTE — Op Note (Signed)
NAME:  Wendy Fitzpatrick, Wendy Fitzpatrick                  ACCOUNT NO.:  [id number]   MEDICAL RECORD NO.:  [id number]          PATIENT TYPE:  AMB   LOCATION:  DSC                          FACILITY:  [facility]   PHYSICIAN:  Loreta Ave, M.D. DATE OF BIRTH:  1941-01-03   DATE OF PROCEDURE:  03/12/2005  DATE OF DISCHARGE:                                 OPERATIVE REPORT   PREOPERATIVE DIAGNOSIS:  Left knee lateral meniscus tear with posterior horn  lateral meniscal cyst, diffuse moderate tricompartmental degenerative  changes.   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   OPERATIVE PROCEDURE:  Left knee exam under anesthesia, arthroscopy,  extensive lateral meniscectomy with excision posterior horn lateral meniscus  cyst, chondroplasty medial compartment and patellofemoral joint.   SURGEON:  Loreta Ave, M.D.   ASSISTANT:  Genene Churn. Owens, P.A.-C.   ANESTHESIA:  Knee block with sedation.   SPECIMENS:  None.   CULTURES:  None.   COMPLICATIONS:  None.   DRESSINGS:  Soft compressive.   PROCEDURE:  The patient was brought to the operating room and after adequate  anesthesia had been obtained, the left knee was examined.  Full motion, good  stability.  Placed in the leg holder and prepped and draped in the usual  sterile fashion.  Three portals were created, one superolateral and one each  medial and lateral parapatellar.  Inflow catheter introduced, knee  distended, arthroscope introduced, knee inspected.  Grade 2 and 3  degenerative changes of the patellofemoral joint, chondroplasty throughout.  Good tracking was noted.  The cruciate ligament was intact.  Medial  compartment grade 3 changes entire weight-bearing dome with marked thinning  but not grade 4.  Grade 3 of the plateau, as well.  Some degeneration of the  posterior  and medial meniscus but no frank tears.  Chondroplasty to a stable  surface.  Medial meniscus assessed and excision not indicated.  Laterally,  there was a discoid meniscus filling almost the entirety of the compartment.  This was very soft with intrameniscal tearing and degeneration over most of  it.  I first saucerized out the mid portion of the discoid meniscus trying  to retain a rim all the way around.  This continued to have marked  intrameniscal tearing throughout so essentially did a near complete lateral  meniscectomy removing the anterior half but just leaving a little intact.  The cyst coming off the back of the meniscus could be easily assessed  arthroscopically and was resected off the back side of the lateral meniscus,  care taken to protect neurovascular structures in the back.  At completion,  there was a little bit of the posterior horn still intact for the back  attachment of the popliteal tendon.  Most of the anterior half had to be  removed.  The entire knee examined, no other findings appreciated.  All  recess examined to make sure we had all fragments out.  Instruments and  fluid  removed.  The portals of the knee injected with Marcaine.  The portals were  closed with 4-0 nylon.  Sterile compressive dressing applied.  Anesthesia  reversed.  Brought to the recovery room.  Tolerated the surgery well without  complications.      Loreta Ave, M.D.  Electronically Signed     DFM/MEDQ  D:  03/12/2005  T:  03/12/2005  Job:  [id number]

## 2010-10-03 NOTE — H&P (Signed)
NAME:  Wendy Fitzpatrick, PODGURSKI                  ACCOUNT NO.:  [id number]   MEDICAL RECORD NO.:  [id number]          PATIENT TYPE:  INP   LOCATION:  2007                         FACILITY:  [facility]   PHYSICIAN:  Learta Codding, M.D. LHCDATE OF BIRTH:  [birth year]   DATE OF ADMISSION:  11/18/2005  DATE OF DISCHARGE:                                HISTORY & PHYSICAL   CHIEF COMPLAINT:  Transfer from [facility] secondary to chest  discomfort.   HISTORY OF PRESENT ILLNESS:  Mrs. 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  This was a new symptom for  her.  She had had some indigestion a couple of days prior.  She actually  presented to the emergency room that night because she did have an episode  of syncope.  This was witnessed by her daughter-in-law who is a family  physician in [place].  The patient was noted to have some left-sided  posturing as well as left-sided facial droop.  This was concerning for a  TIA.  She was apparently set up for outpatient carotid Dopplers; however,  she represented to [facility] with chest discomfort as described  above.  The patient ruled out for myocardial infarction by enzymes.  She was  set up for a nuclear study.  She apparently had hypotension with the  adenosine infusion and was switched to a dobutamine Cardiolite.  This was  read out as significant for possible apical ischemia.  The patient was  transferred to [facility] for cardiac catheterization for further  evaluation.  She does have long history of vasovagal syncope.  This dates  back to age 68 years old.  She has had several episodes of this throughout  her life.  The episode that happened in the car the other night that seemed  to be consistent with a TIA was completely different from her  prior  episodes.  Today while getting into the ambulance in transfer from [facility] to [facility], she did have a syncopal episode.  There  were no telemetry strips available for review.  The patient was apparently  only unconscious for several seconds.  She notes that this episode today is  just like what she has had in the past with her vasovagal syncope.  She  currently has some light left-sided chest pressure.  She is currently in  normal sinus rhythm with a heart rate in the 60s.   PAST MEDICAL HISTORY:  Significant for hypertension, hypercholesterolemia,  untreated, degenerative joint disease, gastroesophageal reflux disease,  obesity, migraine headaches, depression/anxiety, and allergic rhinitis.   PAST SURGICAL HISTORY:  Significant for ovarian cyst removal, repair of  cystocele and rectocele, total abdominal hysterectomy, appendectomy,  tonsillectomy, and adenoidectomy, and left knee arthroscopy.   MEDICATIONS:  Home medications include Wellbutrin 300 mg daily, Lexapro 20  mg daily, atenolol 25 mg daily, Lotensin 10 mg daily, Celebrex daily,  Protonix  20 daily, Fioricet p.r.n., glucosamine daily, vitamin D daily,  chondroitin daily.   ALLERGIES:  NO KNOWN DRUG ALLERGIES.   SOCIAL HISTORY:  The patient lives in [place] with her husband.  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  She has  two sons.  Her daughter-in-law is Dr. Delmer Islam who is a family  physician in [place].  She denies any tobacco or alcohol abuse.   FAMILY HISTORY:  Significant for coronary disease in her brother who  underwent percutaneous coronary intervention in his 36s.  Her father is  deceased from colon cancer.  Her is deceased and had a history of strokes.   REVIEW OF SYSTEMS:  Please see HPI.  She denies any fevers, chills,  headaches, sore throat, rashes, dysuria, hematuria, melena hematochezia.  She did have a UTI diagnosed in the emergency  room at [facility].  This was treated with Rocephin.  Rest of the review of systems is negative.   PHYSICAL EXAMINATION:  GENERAL:  She is well-nourished, well-developed  female.  VITAL SIGNS:  Blood pressure 108/48, pulse 60, respirations 15, temperature  97.1.  HEENT:  Head normocephalic, atraumatic.  Eyes, PERRLA, EOMI, sclerae clear.  Oropharynx pink without exudate.  NECK:  Without lymphadenopathy.  Endocrine:  Without thyromegaly.  Vascular:  Carotids are 2+ bilaterally without bruits.  Femoral artery pulses are 2+  bilaterally without bruits.  CARDIAC:  Normal S1, S2, regular rate and rhythm without murmurs, rubs, or  gallops.  LUNGS:  Clear to auscultation without wheezing, rhonchi, rales.  ABDOMEN:  Soft, nontender, normoactive bowel sounds.  No organomegaly, no  rebound or guarding.  EXTREMITIES:  Without clubbing, cyanosis, or edema.  Calves are soft,  nontender.  MUSCULOSKELETAL:  Without joint deformity.  NEUROLOGIC:  She is alert and oriented x3.  Cranial nerves II-XII are  grossly intact.  Strength is 5/5 in all extremity muscle groups.   DIAGNOSTIC STUDIES:  Chest x-ray from July 2 shows borderline cardiomegaly,  elevated right hemidiaphragm, no acute changes.  Head CT from November 16, 2005,  showed no significant abnormality.  Echocardiogram and carotid Dopplers were  performed at [facility], but the results at this time are unknown.   LABORATORY DATA:  Total cholesterol 200, triglycerides 133, HDL 50, LDL 123.  Cardiac markers negative x2.  Electrocardiogram revealed sinus rhythm with a  heart rate of 63, normal axis, no acute changes.   ASSESSMENT/PLAN:  1.  Unstable angina pectoris with possible apical ischemia on nuclear study.      We plan to accept the patient in transfer and continue, aspirin,      Lovenox, beta blocker, nitro paste.  We will also add a statin given     that she is admitted with unstable angina and her LDL is greater than       70.  We will plan on cardiac catheterization November 19, 2005.  Risks and      benefits of the procedure have been discussed with the patient.  She      agrees to proceed.  2.  Rule out transient ischemic attack.  The patient had an echocardiogram      and carotid Dopplers performed at [facility].  We will try to      obtain the results of those.  We will also obtain and MRI and MRA of her      brain with defusion weighted images to rule out recent stroke.  Depending on the results of that, we may need to consider a neurology      consultation.  3.  Vasovagal syncope.  We will continue to monitor for this.  She has a      long history of this and further workup is warranted at this time.  4.  Hypertension.  This is currently controlled.  We will continue her      current medications.  5.  Gastroesophageal reflux disease.  We will continue her on her proton      pump inhibitor.  6.  Depression/anxiety.  We will continue her on her Wellbutrin and Lexapro.   The patient was also interviewed and examined by Dr. Andee Lineman today who agreed  with the above assessment and plan.  Dr. Andee Lineman will be in touch with the  patient's daughter-in-law.  The patient is agreeable to [place] apprising her  daughter-in-law of the patient's condition.      Tereso Newcomer, P.A.      Learta Codding, M.D. [facility]  Electronically Signed    SW/MEDQ  D:  11/18/2005  T:  11/18/2005  Job:  [id number]   cc:   Simone Curia  Fax: [phone]

## 2010-10-07 ENCOUNTER — Ambulatory Visit: Payer: Self-pay | Admitting: Internal Medicine

## 2010-11-17 ENCOUNTER — Ambulatory Visit: Payer: Self-pay | Admitting: Internal Medicine

## 2011-01-07 ENCOUNTER — Telehealth: Payer: Self-pay | Admitting: Internal Medicine

## 2011-01-07 NOTE — Telephone Encounter (Signed)
Message copied by Arna Snipe on Wed Jan 07, 2011 10:47 AM ------      Message from: Harlow Mares D      Created: Mon Nov 17, 2010  8:56 AM       Please bill pt for same day no show.       ----- Message -----         From: Holli Humbles         Sent: 11/17/2010   8:52 AM           To: Harlow Mares, CMA            Pt. canceled her appt. because she started seeing another GI Dr. in [place]

## 2011-07-28 ENCOUNTER — Other Ambulatory Visit: Payer: Self-pay | Admitting: Internal Medicine

## 2011-07-28 DIAGNOSIS — K429 Umbilical hernia without obstruction or gangrene: Secondary | ICD-10-CM

## 2011-07-29 ENCOUNTER — Ambulatory Visit
Admission: RE | Admit: 2011-07-29 | Discharge: 2011-07-29 | Disposition: A | Discharge status: Home / Self Care | Payer: Medicare Other | Source: Ambulatory Visit | Attending: Internal Medicine | Admitting: Internal Medicine

## 2011-07-29 DIAGNOSIS — K429 Umbilical hernia without obstruction or gangrene: Secondary | ICD-10-CM

## 2013-06-21 ENCOUNTER — Other Ambulatory Visit (HOSPITAL_COMMUNITY): Payer: Self-pay | Admitting: Internal Medicine

## 2013-06-21 ENCOUNTER — Ambulatory Visit (HOSPITAL_COMMUNITY)
Admission: RE | Admit: 2013-06-21 | Discharge: 2013-06-21 | Disposition: A | Discharge status: Home / Self Care | Payer: Medicare HMO | Source: Ambulatory Visit | Attending: Internal Medicine | Admitting: Internal Medicine

## 2013-06-21 DIAGNOSIS — R0600 Dyspnea, unspecified: Secondary | ICD-10-CM

## 2013-06-21 DIAGNOSIS — K449 Diaphragmatic hernia without obstruction or gangrene: Secondary | ICD-10-CM | POA: Insufficient documentation

## 2013-06-21 DIAGNOSIS — R079 Chest pain, unspecified: Secondary | ICD-10-CM

## 2013-06-21 DIAGNOSIS — I700 Atherosclerosis of aorta: Secondary | ICD-10-CM | POA: Insufficient documentation

## 2013-06-21 DIAGNOSIS — R0602 Shortness of breath: Secondary | ICD-10-CM | POA: Insufficient documentation

## 2013-06-21 DIAGNOSIS — I771 Stricture of artery: Secondary | ICD-10-CM | POA: Insufficient documentation

## 2013-06-21 DIAGNOSIS — K7689 Other specified diseases of liver: Secondary | ICD-10-CM | POA: Insufficient documentation

## 2013-06-21 DIAGNOSIS — J479 Bronchiectasis, uncomplicated: Secondary | ICD-10-CM | POA: Insufficient documentation

## 2013-06-21 DIAGNOSIS — D7389 Other diseases of spleen: Secondary | ICD-10-CM | POA: Insufficient documentation

## 2013-06-21 DIAGNOSIS — I708 Atherosclerosis of other arteries: Secondary | ICD-10-CM | POA: Insufficient documentation

## 2013-06-21 LAB — POCT I-STAT, CHEM 8
BUN: 27 mg/dL — ABNORMAL HIGH (ref 6–23)
CREATININE: 1.8 mg/dL — AB (ref 0.50–1.10)
Calcium, Ion: 1.15 mmol/L (ref 1.13–1.30)
Chloride: 102 mEq/L (ref 96–112)
GLUCOSE: 110 mg/dL — AB (ref 70–99)
HEMATOCRIT: 41 % (ref 36.0–46.0)
HEMOGLOBIN: 13.9 g/dL (ref 12.0–15.0)
Potassium: 3.7 mEq/L (ref 3.7–5.3)
Sodium: 142 mEq/L (ref 137–147)
TCO2: 26 mmol/L (ref 0–100)

## 2013-06-21 MED ORDER — IOHEXOL 350 MG/ML SOLN
80.0000 mL | Freq: Once | INTRAVENOUS | Status: AC | PRN
Start: 2013-06-21 — End: 2013-06-21
  Administered 2013-06-21: 80 mL via INTRAVENOUS

## 2014-02-14 ENCOUNTER — Ambulatory Visit (INDEPENDENT_AMBULATORY_CARE_PROVIDER_SITE_OTHER): Payer: Medicare HMO | Admitting: Neurology

## 2014-02-14 ENCOUNTER — Encounter: Payer: Self-pay | Admitting: Neurology

## 2014-02-14 ENCOUNTER — Encounter (INDEPENDENT_AMBULATORY_CARE_PROVIDER_SITE_OTHER): Payer: Self-pay

## 2014-02-14 VITALS — BP 150/92 | HR 93 | Temp 98.3°F | Resp 22 | Ht 63.0 in | Wt 201.0 lb

## 2014-02-14 DIAGNOSIS — E669 Obesity, unspecified: Secondary | ICD-10-CM

## 2014-02-14 DIAGNOSIS — G4710 Hypersomnia, unspecified: Secondary | ICD-10-CM | POA: Insufficient documentation

## 2014-02-14 DIAGNOSIS — G4730 Sleep apnea, unspecified: Principal | ICD-10-CM

## 2014-02-14 DIAGNOSIS — G4733 Obstructive sleep apnea (adult) (pediatric): Secondary | ICD-10-CM

## 2014-02-14 DIAGNOSIS — Z9989 Dependence on other enabling machines and devices: Secondary | ICD-10-CM

## 2014-02-14 HISTORY — DX: Obstructive sleep apnea (adult) (pediatric): G47.33

## 2014-02-14 NOTE — Progress Notes (Signed)
SLEEP MEDICINE CLINIC   Provider:  Larey Seat, M D  Referring Provider: Marton Redwood, MD Primary Care Physician:  Wendy Redwood, MD  Chief Complaint  Patient presents with  . Follow-up    3 year follow for excessive sleepiness    HPI:  Wendy Fitzpatrick is a 73 y.o. female  Is seen here as a referral  from Dr. Brigitte Fitzpatrick for a re-evaluation of sleep apnea treatment and effect .  [name] was last seen in this office on 07-29-10 for a followup on an apnea which is treated with CPAP and has continued to use CPAP. She underwent a sleep study in November 2007 and was diagnosed with an AHI of 60. After being titrated to CPAP she was initially able to experience more sound grossly. 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 Alertness and poor or at worst decreased to 6 points as of Spring 2012  Prior to her of CPAP titration she had off and on  complaint of excessive daytime sleepiness. She also complained of some fatigue. The study was interpreted by Dr. Lonzo Cloud, [place] [facility].  She was titrated to 10 cm water pressure using a mirage Quadrao fullface mask initially.  [name] brought her machine today but there is no memory she currently inserted. I have no access to any recent compliance data. Her fatigue however has increased over the last 6-12 months and she now endorsed a severity of 56 points on a scale that allows for a maximum reading of 63. At the sleepiness score was endorsed at 14 of 24 possible points. Fitzpatrick indicate that she is not getting restful restorative sleep right now and we have to investigate her machine is no longer working or if the settings on the longer were for her. She is still continuing to use a full face mask. She also indicated that she is not that comfortable with a fullface mask and that she would like to try a nasal pillow or nasal mask and steps. She's been  followed by [facility], now [place].  Sleep habits : the patient goes to bed around 10 and 12 o'clock, it will take her 20 minutes to fall asleep. She has 2 nocturia breaks at night. Other wise sleeps through the night. Rises 6.30 AM spontaneous. She feels not refreshed, not restored.  She does not snore through the machine, she is not kicking. She feels the need a nap 3 hours later, she is very fatigued. She naps daily now, for about 3 hours. During these naps, she has intense, vivid , detailed dreams.    Not a caffeine  User, non smoker, non drinker.       Review of Systems: Out of a complete 14 system review, the patient complains of only the following symptoms, and all other reviewed systems are negative. The patient reports fatigue, weight gain chronic swelling in legs and ankles chronic hearing loss, irritable bowel syndrome with bloating and irregular bowel habits, shortness of breath, depression the feeling of much sleep decreased energy change in appetite and best interest in activities but usually provided pleasure. She is also excessively daytime sleepy. She had 2 abdominal hernia surgeries in 2012  Epworth score 14 , Fatigue severity score 56  , depression score 5 .    History   Social History  . Marital Status: Married    Spouse Name: N/A  Number of Children: N/A  . Years of Education: N/A   Occupational History  . nurse    Social History Main Topics  . Smoking status: Never Smoker   . Smokeless tobacco: Former Systems developer  . Alcohol Use: 0.6 oz/week    1 Glasses of wine per week  . Drug Use: No  . Sexual Activity: Not on file   Other Topics Concern  . Not on file   Social History Narrative  . No narrative on file    Family History  Problem Relation Age of Onset  . Hypertension Mother   . Dementia Mother   . Stroke Mother     TIA  . Colon cancer Father     Past Medical History  Diagnosis Date  . Hypertension   . Obesity   . Hyperlipidemia   . Anxiety and  depression   . GERD (gastroesophageal reflux disease)   . Migraine   . Diverticulosis   . Tubular adenoma   . Degenerative joint disease     Past Surgical History  Procedure Laterality Date  . Ovarian cyst removal    . Rectocele repair      with cystocele repair  . Cystocele repair      with rectocele repair  . Total abdominal hysterectomy    . Appendectomy    . Tonsillectomy and adenoidectomy    . Knee surgery      left    Current Outpatient Prescriptions  Medication Sig Dispense Refill  . diltiazem (CARDIZEM CD) 360 MG 24 hr capsule Take 360 mg by mouth daily.      . DULoxetine (CYMBALTA) 60 MG capsule Take 60 mg by mouth at bedtime.       . furosemide (LASIX) 20 MG tablet Take 20 mg by mouth daily as needed.       . hydrochlorothiazide (HYDRODIURIL) 25 MG tablet Take 25 mg by mouth daily.      Marland Kitchen omeprazole (PRILOSEC) 20 MG capsule Take 20 mg by mouth 2 (two) times daily before a meal.       . simvastatin (ZOCOR) 20 MG tablet Take 20 mg by mouth daily.        No current facility-administered medications for this visit.    Allergies as of 02/14/2014  . (No Known Allergies)    Vitals: BP 150/92  Fitzpatrick 93  Temp(Src) 98.3 F (36.8 C) (Oral)  Resp 22  Ht 5\' 3"  (1.6 m)  Wt 201 lb (91.173 kg)  BMI 35.61 kg/m2  SpO2 98% Last Weight:  Wt Readings from Last 1 Encounters:  02/14/14 201 lb (91.173 kg)       Last Height:   Ht Readings from Last 1 Encounters:  02/14/14 5\' 3"  (1.6 m)    Physical exam:  General: The patient is awake, alert and appears not in acute distress. The patient is well groomed. Head: Normocephalic, atraumatic. Neck is supple. Mallampati 3,  neck circumference: 16. Nasal airflow unrestricted ,  TMJ is not evident.  Retrognathia is seen.  Cardiovascular:  Regular rate and rhythm , without  murmurs or carotid bruit, and without distended neck veins. Respiratory: Lungs are clear to auscultation. Skin:  Without evidence of  Rash- leg edema for  many years .  Trunk: BMI is  elevated and patient  has normal posture.  Neurologic exam : The patient is awake and alert, oriented to place and time.   Memory subjective  described as intact. There is a normal attention span & concentration  ability. Speech is fluent without  dysarthria, dysphonia or aphasia. Mood and affect are appropriate.  Cranial nerves: Pupils are equal and briskly reactive to light.  Status post cataract surgery 2014 .Funduscopic exam without  evidence of pallor or edema. Extraocular movements  in vertical and horizontal planes intact and without nystagmus. Visual fields by finger perimetry are intact. Hearing to finger rub intact.  Facial sensation intact to fine touch. Facial motor strength is symmetric and tongue and uvula move midline.  Motor exam:  Normal tone, muscle bulk and symmetric strength in all extremities.  Sensory:  Fine touch, pinprick and vibration were tested in all extremities.  Proprioception is tested in the upper extremities only. This was  normal.  Coordination: Rapid alternating movements in the fingers/hands is normal.  Finger-to-nose maneuver  normal without evidence of ataxia, dysmetria or tremor.  Gait and station: Patient walks without assistive device and is able unassisted to climb up to the exam table.  Strength within normal limits. Stance is stable and normal.  Deep tendon reflexes: in the  upper and lower extremities are symmetric and intact. Babinski maneuver response is  downgoing.   Assessment:  After physical and neurologic examination, review of laboratory studies, imaging, neurophysiology testing and pre-existing records, assessment is   1) treated OSA with CPAP use, but patient seems not longer to feel a benefit. Re-evaluate in a split study, we may need to use a nasal mask for the patient with retrognathia , allowing a better seal. 2) risk factors are retrognathia, neck and body size.  3) migraine s are treated , but morning  headaches , bifrontal   The patient was advised of the nature of the diagnosed sleep disorder , the treatment options and risks for general a health and wellness arising from not treating the condition. Visit duration was 30 minutes.   Plan:  Treatment plan and additional workup :  SPLIT at [employer], AHI 15 and score at 4 % ,  morning headaches, need CO2.      Asencion Partridge Ivylynn Hoppes MD  02/14/2014

## 2014-02-14 NOTE — Patient Instructions (Signed)
Sleep Apnea  Sleep apnea is a sleep disorder characterized by abnormal pauses in breathing while you sleep. When your breathing pauses, the level of oxygen in your blood decreases. This causes you to move out of deep sleep and into light sleep. As a result, your quality of sleep is poor, and the system that carries your blood throughout your body (cardiovascular system) experiences stress. If sleep apnea remains untreated, the following conditions can develop:  High blood pressure (hypertension).  Coronary artery disease.  Inability to achieve or maintain an erection (impotence).  Impairment of your thought process (cognitive dysfunction). There are three types of sleep apnea: 1. Obstructive sleep apnea--Pauses in breathing during sleep because of a blocked airway. 2. Central sleep apnea--Pauses in breathing during sleep because the area of the brain that controls your breathing does not send the correct signals to the muscles that control breathing. 3. Mixed sleep apnea--A combination of both obstructive and central sleep apnea. RISK FACTORS The following risk factors can increase your risk of developing sleep apnea:  Being overweight.  Smoking.  Having narrow passages in your nose and throat.  Being of older age.  Being female.  Alcohol use.  Sedative and tranquilizer use.  Ethnicity. Among individuals younger than 35 years, African Americans are at increased risk of sleep apnea. SYMPTOMS   Difficulty staying asleep.  Daytime sleepiness and fatigue.  Loss of energy.  Irritability.  Loud, heavy snoring.  Morning headaches.  Trouble concentrating.  Forgetfulness.  Decreased interest in sex. DIAGNOSIS  In order to diagnose sleep apnea, your caregiver will perform a physical examination. Your caregiver may suggest that you take a home sleep test. Your caregiver may also recommend that you spend the night in a sleep lab. In the sleep lab, several monitors record  information about your heart, lungs, and brain while you sleep. Your leg and arm movements and blood oxygen level are also recorded. TREATMENT The following actions may help to resolve mild sleep apnea:  Sleeping on your side.   Using a decongestant if you have nasal congestion.   Avoiding the use of depressants, including alcohol, sedatives, and narcotics.   Losing weight and modifying your diet if you are overweight. There also are devices and treatments to help open your airway:  Oral appliances. These are custom-made mouthpieces that shift your lower jaw forward and slightly open your bite. This opens your airway.  Devices that create positive airway pressure. This positive pressure "splints" your airway open to help you breathe better during sleep. The following devices create positive airway pressure:  Continuous positive airway pressure (CPAP) device. The CPAP device creates a continuous level of air pressure with an air pump. The air is delivered to your airway through a mask while you sleep. This continuous pressure keeps your airway open.  Nasal expiratory positive airway pressure (EPAP) device. The EPAP device creates positive air pressure as you exhale. The device consists of single-use valves, which are inserted into each nostril and held in place by adhesive. The valves create very little resistance when you inhale but create much more resistance when you exhale. That increased resistance creates the positive airway pressure. This positive pressure while you exhale keeps your airway open, making it easier to breath when you inhale again.  Bilevel positive airway pressure (BPAP) device. The BPAP device is used mainly in patients with central sleep apnea. This device is similar to the CPAP device because it also uses an air pump to deliver continuous air pressure   through a mask. However, with the BPAP machine, the pressure is set at two different levels. The pressure when you  exhale is lower than the pressure when you inhale.  Surgery. Typically, surgery is only done if you cannot comply with less invasive treatments or if the less invasive treatments do not improve your condition. Surgery involves removing excess tissue in your airway to create a wider passage way. Document Released: 04/24/2002 Document Revised: 08/29/2012 Document Reviewed: 09/10/2011 Exit[employer] Patient Information 2015 [employer]. This information is not intended to replace advice given to you by your health care provider. Make sure you discuss any questions you have with your health care provider.  

## 2014-02-19 ENCOUNTER — Ambulatory Visit (INDEPENDENT_AMBULATORY_CARE_PROVIDER_SITE_OTHER): Payer: Medicare HMO

## 2014-02-19 DIAGNOSIS — G4733 Obstructive sleep apnea (adult) (pediatric): Secondary | ICD-10-CM

## 2014-02-19 DIAGNOSIS — G4710 Hypersomnia, unspecified: Secondary | ICD-10-CM

## 2014-02-19 DIAGNOSIS — R0902 Hypoxemia: Secondary | ICD-10-CM

## 2014-02-19 DIAGNOSIS — E669 Obesity, unspecified: Secondary | ICD-10-CM

## 2014-02-19 DIAGNOSIS — G4730 Sleep apnea, unspecified: Principal | ICD-10-CM

## 2014-03-05 ENCOUNTER — Telehealth: Payer: Self-pay | Admitting: Neurology

## 2014-03-05 ENCOUNTER — Other Ambulatory Visit: Payer: Self-pay | Admitting: Neurology

## 2014-03-05 ENCOUNTER — Encounter: Payer: Self-pay | Admitting: *Deleted

## 2014-03-05 DIAGNOSIS — G4733 Obstructive sleep apnea (adult) (pediatric): Secondary | ICD-10-CM

## 2014-03-05 NOTE — Telephone Encounter (Signed)
Patient was contacted and provided the results of her split night sleep study which revealed obstructive sleep apnea.  Patient was in agreement with beginning CPAP therapy at home and was referred to [facility] for CPAP set up.  A copy of the report was faxed to Dr. Marton Redwood and a copy was mailed to the patient.   Patient instructed to contact our office 6-8 weeks post set up to schedule a follow up appointment.

## 2014-05-20 DIAGNOSIS — G4733 Obstructive sleep apnea (adult) (pediatric): Secondary | ICD-10-CM | POA: Diagnosis not present

## 2014-05-24 DIAGNOSIS — I10 Essential (primary) hypertension: Secondary | ICD-10-CM | POA: Diagnosis not present

## 2014-05-24 DIAGNOSIS — Z6835 Body mass index (BMI) 35.0-35.9, adult: Secondary | ICD-10-CM | POA: Diagnosis not present

## 2014-05-24 DIAGNOSIS — R52 Pain, unspecified: Secondary | ICD-10-CM | POA: Diagnosis not present

## 2014-05-24 DIAGNOSIS — R5383 Other fatigue: Secondary | ICD-10-CM | POA: Diagnosis not present

## 2014-05-24 DIAGNOSIS — R05 Cough: Secondary | ICD-10-CM | POA: Diagnosis not present

## 2014-05-24 DIAGNOSIS — J069 Acute upper respiratory infection, unspecified: Secondary | ICD-10-CM | POA: Diagnosis not present

## 2014-05-24 DIAGNOSIS — H6123 Impacted cerumen, bilateral: Secondary | ICD-10-CM | POA: Diagnosis not present

## 2014-06-20 DIAGNOSIS — G4733 Obstructive sleep apnea (adult) (pediatric): Secondary | ICD-10-CM | POA: Diagnosis not present

## 2014-06-21 DIAGNOSIS — G4733 Obstructive sleep apnea (adult) (pediatric): Secondary | ICD-10-CM | POA: Diagnosis not present

## 2014-07-19 DIAGNOSIS — G4733 Obstructive sleep apnea (adult) (pediatric): Secondary | ICD-10-CM | POA: Diagnosis not present

## 2014-08-19 DIAGNOSIS — G4733 Obstructive sleep apnea (adult) (pediatric): Secondary | ICD-10-CM | POA: Diagnosis not present

## 2014-08-31 DIAGNOSIS — R0602 Shortness of breath: Secondary | ICD-10-CM | POA: Diagnosis not present

## 2014-08-31 DIAGNOSIS — R591 Generalized enlarged lymph nodes: Secondary | ICD-10-CM | POA: Diagnosis not present

## 2014-08-31 DIAGNOSIS — Z6836 Body mass index (BMI) 36.0-36.9, adult: Secondary | ICD-10-CM | POA: Diagnosis not present

## 2014-08-31 DIAGNOSIS — R0789 Other chest pain: Secondary | ICD-10-CM | POA: Diagnosis not present

## 2014-08-31 DIAGNOSIS — M25519 Pain in unspecified shoulder: Secondary | ICD-10-CM | POA: Diagnosis not present

## 2014-09-18 DIAGNOSIS — G4733 Obstructive sleep apnea (adult) (pediatric): Secondary | ICD-10-CM | POA: Diagnosis not present

## 2014-09-20 DIAGNOSIS — G4733 Obstructive sleep apnea (adult) (pediatric): Secondary | ICD-10-CM | POA: Diagnosis not present

## 2014-10-19 DIAGNOSIS — G4733 Obstructive sleep apnea (adult) (pediatric): Secondary | ICD-10-CM | POA: Diagnosis not present

## 2014-11-01 DIAGNOSIS — R079 Chest pain, unspecified: Secondary | ICD-10-CM | POA: Diagnosis not present

## 2014-11-01 DIAGNOSIS — R0789 Other chest pain: Secondary | ICD-10-CM | POA: Diagnosis not present

## 2014-11-01 DIAGNOSIS — K449 Diaphragmatic hernia without obstruction or gangrene: Secondary | ICD-10-CM | POA: Diagnosis not present

## 2014-11-05 DIAGNOSIS — Z6837 Body mass index (BMI) 37.0-37.9, adult: Secondary | ICD-10-CM | POA: Diagnosis not present

## 2014-11-05 DIAGNOSIS — R1310 Dysphagia, unspecified: Secondary | ICD-10-CM | POA: Diagnosis not present

## 2014-11-05 DIAGNOSIS — R0789 Other chest pain: Secondary | ICD-10-CM | POA: Diagnosis not present

## 2014-11-18 DIAGNOSIS — G4733 Obstructive sleep apnea (adult) (pediatric): Secondary | ICD-10-CM | POA: Diagnosis not present

## 2014-12-04 DIAGNOSIS — K210 Gastro-esophageal reflux disease with esophagitis: Secondary | ICD-10-CM | POA: Diagnosis not present

## 2014-12-04 DIAGNOSIS — K449 Diaphragmatic hernia without obstruction or gangrene: Secondary | ICD-10-CM | POA: Diagnosis not present

## 2014-12-19 DIAGNOSIS — G4733 Obstructive sleep apnea (adult) (pediatric): Secondary | ICD-10-CM | POA: Diagnosis not present

## 2014-12-24 DIAGNOSIS — G4733 Obstructive sleep apnea (adult) (pediatric): Secondary | ICD-10-CM | POA: Diagnosis not present

## 2014-12-27 DIAGNOSIS — R8290 Unspecified abnormal findings in urine: Secondary | ICD-10-CM | POA: Diagnosis not present

## 2014-12-27 DIAGNOSIS — R7301 Impaired fasting glucose: Secondary | ICD-10-CM | POA: Diagnosis not present

## 2014-12-27 DIAGNOSIS — I10 Essential (primary) hypertension: Secondary | ICD-10-CM | POA: Diagnosis not present

## 2014-12-27 DIAGNOSIS — R928 Other abnormal and inconclusive findings on diagnostic imaging of breast: Secondary | ICD-10-CM | POA: Diagnosis not present

## 2014-12-27 DIAGNOSIS — N390 Urinary tract infection, site not specified: Secondary | ICD-10-CM | POA: Diagnosis not present

## 2015-01-03 DIAGNOSIS — D649 Anemia, unspecified: Secondary | ICD-10-CM | POA: Diagnosis not present

## 2015-01-03 DIAGNOSIS — R1310 Dysphagia, unspecified: Secondary | ICD-10-CM | POA: Diagnosis not present

## 2015-01-03 DIAGNOSIS — I10 Essential (primary) hypertension: Secondary | ICD-10-CM | POA: Diagnosis not present

## 2015-01-03 DIAGNOSIS — F329 Major depressive disorder, single episode, unspecified: Secondary | ICD-10-CM | POA: Diagnosis not present

## 2015-01-03 DIAGNOSIS — Z0000 Encounter for general adult medical examination without abnormal findings: Secondary | ICD-10-CM | POA: Diagnosis not present

## 2015-01-03 DIAGNOSIS — N183 Chronic kidney disease, stage 3 (moderate): Secondary | ICD-10-CM | POA: Diagnosis not present

## 2015-01-03 DIAGNOSIS — M2550 Pain in unspecified joint: Secondary | ICD-10-CM | POA: Diagnosis not present

## 2015-01-03 DIAGNOSIS — E785 Hyperlipidemia, unspecified: Secondary | ICD-10-CM | POA: Diagnosis not present

## 2015-01-03 DIAGNOSIS — E1129 Type 2 diabetes mellitus with other diabetic kidney complication: Secondary | ICD-10-CM | POA: Diagnosis not present

## 2015-01-14 DIAGNOSIS — Z1212 Encounter for screening for malignant neoplasm of rectum: Secondary | ICD-10-CM | POA: Diagnosis not present

## 2015-01-15 DIAGNOSIS — K229 Disease of esophagus, unspecified: Secondary | ICD-10-CM | POA: Diagnosis not present

## 2015-01-15 DIAGNOSIS — R1013 Epigastric pain: Secondary | ICD-10-CM | POA: Diagnosis not present

## 2015-01-15 DIAGNOSIS — K449 Diaphragmatic hernia without obstruction or gangrene: Secondary | ICD-10-CM | POA: Diagnosis not present

## 2015-01-15 DIAGNOSIS — K224 Dyskinesia of esophagus: Secondary | ICD-10-CM | POA: Diagnosis not present

## 2015-01-15 DIAGNOSIS — K219 Gastro-esophageal reflux disease without esophagitis: Secondary | ICD-10-CM | POA: Diagnosis not present

## 2015-01-19 DIAGNOSIS — G4733 Obstructive sleep apnea (adult) (pediatric): Secondary | ICD-10-CM | POA: Diagnosis not present

## 2015-01-28 ENCOUNTER — Other Ambulatory Visit: Payer: Self-pay

## 2015-01-28 DIAGNOSIS — D131 Benign neoplasm of stomach: Secondary | ICD-10-CM | POA: Diagnosis not present

## 2015-01-28 DIAGNOSIS — I4891 Unspecified atrial fibrillation: Secondary | ICD-10-CM | POA: Diagnosis not present

## 2015-01-28 DIAGNOSIS — G4733 Obstructive sleep apnea (adult) (pediatric): Secondary | ICD-10-CM | POA: Diagnosis not present

## 2015-01-28 DIAGNOSIS — F329 Major depressive disorder, single episode, unspecified: Secondary | ICD-10-CM | POA: Diagnosis not present

## 2015-01-28 DIAGNOSIS — K317 Polyp of stomach and duodenum: Secondary | ICD-10-CM | POA: Diagnosis not present

## 2015-01-28 DIAGNOSIS — I10 Essential (primary) hypertension: Secondary | ICD-10-CM | POA: Diagnosis not present

## 2015-01-28 DIAGNOSIS — K219 Gastro-esophageal reflux disease without esophagitis: Secondary | ICD-10-CM | POA: Diagnosis not present

## 2015-01-28 DIAGNOSIS — E785 Hyperlipidemia, unspecified: Secondary | ICD-10-CM | POA: Diagnosis not present

## 2015-01-28 DIAGNOSIS — D509 Iron deficiency anemia, unspecified: Secondary | ICD-10-CM | POA: Diagnosis not present

## 2015-01-28 DIAGNOSIS — K449 Diaphragmatic hernia without obstruction or gangrene: Secondary | ICD-10-CM | POA: Diagnosis not present

## 2015-01-28 DIAGNOSIS — F0390 Unspecified dementia without behavioral disturbance: Secondary | ICD-10-CM | POA: Diagnosis not present

## 2015-01-28 DIAGNOSIS — D649 Anemia, unspecified: Secondary | ICD-10-CM | POA: Diagnosis not present

## 2015-02-14 DIAGNOSIS — Z6835 Body mass index (BMI) 35.0-35.9, adult: Secondary | ICD-10-CM | POA: Diagnosis not present

## 2015-02-14 DIAGNOSIS — K317 Polyp of stomach and duodenum: Secondary | ICD-10-CM | POA: Diagnosis not present

## 2015-02-14 DIAGNOSIS — D649 Anemia, unspecified: Secondary | ICD-10-CM | POA: Diagnosis not present

## 2015-02-14 DIAGNOSIS — K219 Gastro-esophageal reflux disease without esophagitis: Secondary | ICD-10-CM | POA: Diagnosis not present

## 2015-02-15 DIAGNOSIS — R5381 Other malaise: Secondary | ICD-10-CM | POA: Diagnosis not present

## 2015-02-15 DIAGNOSIS — K317 Polyp of stomach and duodenum: Secondary | ICD-10-CM | POA: Insufficient documentation

## 2015-02-15 DIAGNOSIS — I10 Essential (primary) hypertension: Secondary | ICD-10-CM | POA: Insufficient documentation

## 2015-02-15 DIAGNOSIS — K449 Diaphragmatic hernia without obstruction or gangrene: Secondary | ICD-10-CM | POA: Insufficient documentation

## 2015-02-15 DIAGNOSIS — R51 Headache: Secondary | ICD-10-CM | POA: Diagnosis not present

## 2015-02-15 DIAGNOSIS — K635 Polyp of colon: Secondary | ICD-10-CM | POA: Diagnosis not present

## 2015-02-15 DIAGNOSIS — E119 Type 2 diabetes mellitus without complications: Secondary | ICD-10-CM | POA: Insufficient documentation

## 2015-02-15 DIAGNOSIS — F32A Depression, unspecified: Secondary | ICD-10-CM | POA: Insufficient documentation

## 2015-02-15 DIAGNOSIS — K219 Gastro-esophageal reflux disease without esophagitis: Secondary | ICD-10-CM | POA: Insufficient documentation

## 2015-02-15 DIAGNOSIS — E785 Hyperlipidemia, unspecified: Secondary | ICD-10-CM | POA: Insufficient documentation

## 2015-02-15 DIAGNOSIS — K208 Other esophagitis: Secondary | ICD-10-CM | POA: Diagnosis not present

## 2015-02-15 DIAGNOSIS — I129 Hypertensive chronic kidney disease with stage 1 through stage 4 chronic kidney disease, or unspecified chronic kidney disease: Secondary | ICD-10-CM | POA: Diagnosis not present

## 2015-02-15 DIAGNOSIS — N189 Chronic kidney disease, unspecified: Secondary | ICD-10-CM | POA: Diagnosis not present

## 2015-02-15 DIAGNOSIS — F329 Major depressive disorder, single episode, unspecified: Secondary | ICD-10-CM | POA: Insufficient documentation

## 2015-02-18 DIAGNOSIS — K317 Polyp of stomach and duodenum: Secondary | ICD-10-CM | POA: Diagnosis not present

## 2015-02-18 DIAGNOSIS — D131 Benign neoplasm of stomach: Secondary | ICD-10-CM | POA: Diagnosis not present

## 2015-02-18 DIAGNOSIS — K219 Gastro-esophageal reflux disease without esophagitis: Secondary | ICD-10-CM | POA: Diagnosis not present

## 2015-02-18 DIAGNOSIS — R51 Headache: Secondary | ICD-10-CM | POA: Diagnosis not present

## 2015-02-18 DIAGNOSIS — G4733 Obstructive sleep apnea (adult) (pediatric): Secondary | ICD-10-CM | POA: Diagnosis not present

## 2015-02-18 DIAGNOSIS — K208 Other esophagitis: Secondary | ICD-10-CM | POA: Diagnosis not present

## 2015-02-18 DIAGNOSIS — R5381 Other malaise: Secondary | ICD-10-CM | POA: Diagnosis not present

## 2015-02-18 DIAGNOSIS — K210 Gastro-esophageal reflux disease with esophagitis: Secondary | ICD-10-CM | POA: Diagnosis not present

## 2015-02-18 DIAGNOSIS — N189 Chronic kidney disease, unspecified: Secondary | ICD-10-CM | POA: Diagnosis not present

## 2015-02-18 DIAGNOSIS — K449 Diaphragmatic hernia without obstruction or gangrene: Secondary | ICD-10-CM | POA: Diagnosis not present

## 2015-02-18 DIAGNOSIS — K635 Polyp of colon: Secondary | ICD-10-CM | POA: Diagnosis not present

## 2015-02-18 DIAGNOSIS — I129 Hypertensive chronic kidney disease with stage 1 through stage 4 chronic kidney disease, or unspecified chronic kidney disease: Secondary | ICD-10-CM | POA: Diagnosis not present

## 2015-03-17 DIAGNOSIS — Z79899 Other long term (current) drug therapy: Secondary | ICD-10-CM | POA: Diagnosis not present

## 2015-03-17 DIAGNOSIS — I10 Essential (primary) hypertension: Secondary | ICD-10-CM | POA: Diagnosis not present

## 2015-03-17 DIAGNOSIS — K2901 Acute gastritis with bleeding: Secondary | ICD-10-CM | POA: Diagnosis not present

## 2015-03-17 DIAGNOSIS — I4891 Unspecified atrial fibrillation: Secondary | ICD-10-CM | POA: Diagnosis not present

## 2015-03-17 DIAGNOSIS — K920 Hematemesis: Secondary | ICD-10-CM | POA: Diagnosis not present

## 2015-03-17 DIAGNOSIS — R0602 Shortness of breath: Secondary | ICD-10-CM | POA: Diagnosis not present

## 2015-03-19 DIAGNOSIS — K922 Gastrointestinal hemorrhage, unspecified: Secondary | ICD-10-CM | POA: Diagnosis not present

## 2015-03-21 DIAGNOSIS — G4733 Obstructive sleep apnea (adult) (pediatric): Secondary | ICD-10-CM | POA: Diagnosis not present

## 2015-03-27 DIAGNOSIS — G4733 Obstructive sleep apnea (adult) (pediatric): Secondary | ICD-10-CM | POA: Diagnosis not present

## 2015-03-28 DIAGNOSIS — K922 Gastrointestinal hemorrhage, unspecified: Secondary | ICD-10-CM | POA: Diagnosis not present

## 2015-03-28 DIAGNOSIS — K219 Gastro-esophageal reflux disease without esophagitis: Secondary | ICD-10-CM | POA: Diagnosis not present

## 2015-03-28 DIAGNOSIS — K317 Polyp of stomach and duodenum: Secondary | ICD-10-CM | POA: Diagnosis not present

## 2015-03-28 DIAGNOSIS — N179 Acute kidney failure, unspecified: Secondary | ICD-10-CM | POA: Diagnosis not present

## 2015-03-28 DIAGNOSIS — Z6834 Body mass index (BMI) 34.0-34.9, adult: Secondary | ICD-10-CM | POA: Diagnosis not present

## 2015-03-28 DIAGNOSIS — D649 Anemia, unspecified: Secondary | ICD-10-CM | POA: Diagnosis not present

## 2015-04-05 DIAGNOSIS — K922 Gastrointestinal hemorrhage, unspecified: Secondary | ICD-10-CM | POA: Diagnosis not present

## 2015-04-05 DIAGNOSIS — I10 Essential (primary) hypertension: Secondary | ICD-10-CM | POA: Diagnosis not present

## 2015-04-05 DIAGNOSIS — R1011 Right upper quadrant pain: Secondary | ICD-10-CM | POA: Diagnosis not present

## 2015-04-05 DIAGNOSIS — E876 Hypokalemia: Secondary | ICD-10-CM | POA: Diagnosis not present

## 2015-04-05 DIAGNOSIS — F329 Major depressive disorder, single episode, unspecified: Secondary | ICD-10-CM | POA: Diagnosis not present

## 2015-04-05 DIAGNOSIS — R1013 Epigastric pain: Secondary | ICD-10-CM | POA: Diagnosis not present

## 2015-04-05 DIAGNOSIS — K449 Diaphragmatic hernia without obstruction or gangrene: Secondary | ICD-10-CM | POA: Diagnosis not present

## 2015-04-05 DIAGNOSIS — F0390 Unspecified dementia without behavioral disturbance: Secondary | ICD-10-CM | POA: Diagnosis not present

## 2015-04-05 DIAGNOSIS — D72829 Elevated white blood cell count, unspecified: Secondary | ICD-10-CM | POA: Diagnosis not present

## 2015-04-05 DIAGNOSIS — I4891 Unspecified atrial fibrillation: Secondary | ICD-10-CM | POA: Diagnosis not present

## 2015-04-05 DIAGNOSIS — K219 Gastro-esophageal reflux disease without esophagitis: Secondary | ICD-10-CM | POA: Diagnosis not present

## 2015-04-05 DIAGNOSIS — K920 Hematemesis: Secondary | ICD-10-CM | POA: Diagnosis not present

## 2015-04-05 DIAGNOSIS — R1012 Left upper quadrant pain: Secondary | ICD-10-CM | POA: Diagnosis not present

## 2015-04-05 DIAGNOSIS — K317 Polyp of stomach and duodenum: Secondary | ICD-10-CM | POA: Diagnosis not present

## 2015-04-05 DIAGNOSIS — K2210 Ulcer of esophagus without bleeding: Secondary | ICD-10-CM | POA: Diagnosis not present

## 2015-04-05 DIAGNOSIS — K2211 Ulcer of esophagus with bleeding: Secondary | ICD-10-CM | POA: Diagnosis not present

## 2015-04-05 DIAGNOSIS — D131 Benign neoplasm of stomach: Secondary | ICD-10-CM | POA: Diagnosis not present

## 2015-04-05 DIAGNOSIS — E860 Dehydration: Secondary | ICD-10-CM | POA: Diagnosis not present

## 2015-04-12 DIAGNOSIS — D649 Anemia, unspecified: Secondary | ICD-10-CM | POA: Diagnosis not present

## 2015-04-12 DIAGNOSIS — K2211 Ulcer of esophagus with bleeding: Secondary | ICD-10-CM | POA: Diagnosis not present

## 2015-04-12 DIAGNOSIS — K922 Gastrointestinal hemorrhage, unspecified: Secondary | ICD-10-CM | POA: Diagnosis not present

## 2015-04-29 DIAGNOSIS — H5213 Myopia, bilateral: Secondary | ICD-10-CM | POA: Diagnosis not present

## 2015-04-29 DIAGNOSIS — H5210 Myopia, unspecified eye: Secondary | ICD-10-CM | POA: Diagnosis not present

## 2015-05-06 DIAGNOSIS — K922 Gastrointestinal hemorrhage, unspecified: Secondary | ICD-10-CM | POA: Diagnosis not present

## 2015-05-06 DIAGNOSIS — K2211 Ulcer of esophagus with bleeding: Secondary | ICD-10-CM | POA: Diagnosis not present

## 2015-05-06 DIAGNOSIS — D649 Anemia, unspecified: Secondary | ICD-10-CM | POA: Diagnosis not present

## 2015-05-23 DIAGNOSIS — Z6834 Body mass index (BMI) 34.0-34.9, adult: Secondary | ICD-10-CM | POA: Diagnosis not present

## 2015-05-23 DIAGNOSIS — E784 Other hyperlipidemia: Secondary | ICD-10-CM | POA: Diagnosis not present

## 2015-05-23 DIAGNOSIS — E1129 Type 2 diabetes mellitus with other diabetic kidney complication: Secondary | ICD-10-CM | POA: Diagnosis not present

## 2015-05-23 DIAGNOSIS — I10 Essential (primary) hypertension: Secondary | ICD-10-CM | POA: Diagnosis not present

## 2015-05-23 DIAGNOSIS — K922 Gastrointestinal hemorrhage, unspecified: Secondary | ICD-10-CM | POA: Diagnosis not present

## 2015-05-23 DIAGNOSIS — D6489 Other specified anemias: Secondary | ICD-10-CM | POA: Diagnosis not present

## 2015-05-23 DIAGNOSIS — N183 Chronic kidney disease, stage 3 (moderate): Secondary | ICD-10-CM | POA: Diagnosis not present

## 2015-06-08 ENCOUNTER — Encounter: Payer: Self-pay | Admitting: Gastroenterology

## 2015-07-11 DIAGNOSIS — G4733 Obstructive sleep apnea (adult) (pediatric): Secondary | ICD-10-CM | POA: Diagnosis not present

## 2015-07-16 DIAGNOSIS — Z6834 Body mass index (BMI) 34.0-34.9, adult: Secondary | ICD-10-CM | POA: Diagnosis not present

## 2015-07-16 DIAGNOSIS — J309 Allergic rhinitis, unspecified: Secondary | ICD-10-CM | POA: Diagnosis not present

## 2015-07-16 DIAGNOSIS — J069 Acute upper respiratory infection, unspecified: Secondary | ICD-10-CM | POA: Diagnosis not present

## 2015-09-10 DIAGNOSIS — R079 Chest pain, unspecified: Secondary | ICD-10-CM | POA: Diagnosis not present

## 2015-09-10 DIAGNOSIS — R0789 Other chest pain: Secondary | ICD-10-CM | POA: Diagnosis not present

## 2015-09-30 DIAGNOSIS — Z6834 Body mass index (BMI) 34.0-34.9, adult: Secondary | ICD-10-CM | POA: Diagnosis not present

## 2015-09-30 DIAGNOSIS — E1129 Type 2 diabetes mellitus with other diabetic kidney complication: Secondary | ICD-10-CM | POA: Diagnosis not present

## 2015-09-30 DIAGNOSIS — I10 Essential (primary) hypertension: Secondary | ICD-10-CM | POA: Diagnosis not present

## 2015-09-30 DIAGNOSIS — G4733 Obstructive sleep apnea (adult) (pediatric): Secondary | ICD-10-CM | POA: Diagnosis not present

## 2015-09-30 DIAGNOSIS — E784 Other hyperlipidemia: Secondary | ICD-10-CM | POA: Diagnosis not present

## 2015-09-30 DIAGNOSIS — R0789 Other chest pain: Secondary | ICD-10-CM | POA: Diagnosis not present

## 2015-09-30 DIAGNOSIS — E668 Other obesity: Secondary | ICD-10-CM | POA: Diagnosis not present

## 2015-09-30 DIAGNOSIS — N183 Chronic kidney disease, stage 3 (moderate): Secondary | ICD-10-CM | POA: Diagnosis not present

## 2015-10-08 DIAGNOSIS — G4733 Obstructive sleep apnea (adult) (pediatric): Secondary | ICD-10-CM | POA: Diagnosis not present

## 2015-11-26 ENCOUNTER — Encounter: Payer: Self-pay | Admitting: *Deleted

## 2015-11-28 ENCOUNTER — Ambulatory Visit (INDEPENDENT_AMBULATORY_CARE_PROVIDER_SITE_OTHER): Payer: Commercial Managed Care - HMO | Admitting: Cardiology

## 2015-11-28 ENCOUNTER — Encounter (INDEPENDENT_AMBULATORY_CARE_PROVIDER_SITE_OTHER): Payer: Self-pay

## 2015-11-28 ENCOUNTER — Encounter: Payer: Self-pay | Admitting: Cardiology

## 2015-11-28 VITALS — BP 124/72 | HR 100 | Ht 63.0 in | Wt 183.0 lb

## 2015-11-28 DIAGNOSIS — R072 Precordial pain: Secondary | ICD-10-CM

## 2015-11-28 DIAGNOSIS — E785 Hyperlipidemia, unspecified: Secondary | ICD-10-CM

## 2015-11-28 DIAGNOSIS — I119 Hypertensive heart disease without heart failure: Secondary | ICD-10-CM | POA: Diagnosis not present

## 2015-11-28 DIAGNOSIS — G4733 Obstructive sleep apnea (adult) (pediatric): Secondary | ICD-10-CM

## 2015-11-28 DIAGNOSIS — R079 Chest pain, unspecified: Secondary | ICD-10-CM | POA: Insufficient documentation

## 2015-11-28 NOTE — Patient Instructions (Signed)
Medication Instructions:   Your physician recommends that you continue on your current medications as directed. Please refer to the Current Medication list given to you today.    Testing/Procedures:  Your physician has requested that you have an echocardiogram. Echocardiography is a painless test that uses sound waves to create images of your heart. It provides your doctor with information about the size and shape of your heart and how well your heart's chambers and valves are working. This procedure takes approximately one hour. There are no restrictions for this procedure.   Your physician has requested that you have a lexiscan myoview. For further information please visit [email or web address]. Please follow instruction sheet, as given.    Follow-Up:  AT DR NELSON'S NEXT AVAILABLE APPOINTMENT       If you need a refill on your cardiac medications before your next appointment, please call your pharmacy.

## 2015-11-28 NOTE — Progress Notes (Signed)
Cardiology Office Note    Date:  11/28/2015   ID:  Wendy Fitzpatrick, DOB 06/13/1940, MRN [id number]  PCP:  Marton Redwood, MD  Cardiologist:  Ena Dawley, MD  Referring physician: Dr. Marton Redwood Chief complaint: Chest pain  History of Present Illness:  Wendy Fitzpatrick is a 75 y.o. female who is a very pleasant patient with prior medical history of hypertension, hyperlipidemia and diabetes mellitus that was diagnosed 2 years ago on a note therapy, obesity who is coming with complaints of intermittent chest pain. She's been experiencing this pain for several years, and went to the ER several times with negative enzymes and unremarkable EKG and was discharged home. She describes her pain as left-sided located in her left breast and in her chest wall with no radiation, lites intermittent can last up to 2 hours and is not associated with shortness of breath or dizziness. It is not related to physical activity, she goes to the [facility] several times a week to do water aerobics however it is associated with emotional or physical stress.   She hasn't had a stress test done in the last few years however many years ago it was negative. She has been treated with Crestor 20 mg daily for her hyperlipidemia and tolerates it well. She also has history of obstructive sleep apnea on CPAP, and hiatal hernia on treatment with PPIs. She has a remote history of cardiac catheterization 2008 that showed clean coronaries. She is married, her husband is also patient of mine. She has never smoked doesn't use alcohol, in her family there is no history of prior sudden cardiac death or coronary artery disease. Her post parents died of old age. Her siblings don't have any cardiac disease. She has occasional palpitations that only last a few seconds with no symptoms of presyncope or syncope. 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 However no syncope.  Occasional lower extremity edema when not compliant with her diet and no orthopnea or paroxysmal nocturnal dyspnea.  Past Medical History  Diagnosis Date  . Hypertension   . Obesity   . Hyperlipidemia   . Anxiety and depression   . GERD (gastroesophageal reflux disease)   . Migraine   . Diverticulosis   . Tubular adenoma   . Degenerative joint disease   . OSA on CPAP 02/14/2014    Past Surgical History  Procedure Laterality Date  . Ovarian cyst removal    . Rectocele repair      with cystocele repair  . Cystocele repair      with rectocele repair  . Total abdominal hysterectomy    . Appendectomy    . Tonsillectomy and adenoidectomy    . Knee surgery      left    Current Medications: Outpatient Prescriptions Prior to Visit  Medication Sig Dispense Refill  . diltiazem (CARDIZEM CD) 360 MG 24 hr capsule Take 360 mg by mouth daily.    . DULoxetine (CYMBALTA) 60 MG capsule Take 60 mg by mouth at bedtime.     . furosemide (LASIX) 20 MG tablet Take 20 mg by mouth daily as needed.     . hydrochlorothiazide (HYDRODIURIL) 25 MG tablet Take 25 mg by mouth daily.    Marland Kitchen omeprazole (PRILOSEC) 20 MG capsule Take 20 mg by mouth 2 (two) times daily before a meal.     . simvastatin (ZOCOR) 20 MG tablet Take 20 mg by mouth  daily.      No facility-administered medications prior to visit.     Allergies:   Adenosine and Pristiq   Social History   Social History  . Marital Status: Married    Spouse Name: N/A  . Number of Children: N/A  . Years of Education: N/A   Occupational History  . nurse    Social History Main Topics  . Smoking status: Never Smoker   . Smokeless tobacco: Former Systems developer  . Alcohol Use: 0.6 oz/week    1 Glasses of wine per week  . Drug Use: No  . Sexual Activity: Not Asked   Other Topics Concern  . None   Social History Narrative     Family History:  The patient's family history includes Colon cancer in her father; Dementia in her mother; Hypertension in  her mother; Transient ischemic attack in her mother.   ROS:   Please see the history of present illness.    ROS All other systems reviewed and are negative.   PHYSICAL EXAM:   VS:  BP 124/72 mmHg  Pulse 100  Ht 5\' 3"  (1.6 m)  Wt 183 lb (83.008 kg)  BMI 32.43 kg/m2   GEN: Well nourished, well developed, in no acute distress HEENT: normal Neck: no JVD, carotid bruits, or masses Cardiac: RRR; no murmurs, rubs, or gallops,no edema  Respiratory:  clear to auscultation bilaterally, normal work of breathing GI: soft, nontender, nondistended, + BS MS: no deformity or atrophy Skin: warm and dry, no rash Neuro:  Alert and Oriented x 3, Strength and sensation are intact Psych: euthymic mood, full affect  Wt Readings from Last 3 Encounters:  11/28/15 183 lb (83.008 kg)  02/14/14 201 lb (91.173 kg)      Studies/Labs Reviewed:   EKG:  EKG is ordered today.  The ekg ordered today demonstrates Sinus rhythm, cannot rule out prior anterior MI age undetermined abnormal EKG.  Recent Labs: No results found for requested labs within last 365 days.   Lipid Panel No results found for: CHOL, TRIG, HDL, CHOLHDL, VLDL, LDLCALC, LDLDIRECT   ASSESSMENT:    1. HLD (hyperlipidemia)   2. Hypertensive heart disease without heart failure   3. Precordial pain   4. OSA (obstructive sleep apnea)   5. Morbid obesity due to excess calories ([place])      PLAN:  In order of problems listed above:  1. Her chest pain sounds atypical, however she has risk factors including her age, diabetes, hypertension, hyperlipidemia we will schedule a Lexiscan nuclear stress test will evaluate for ischemia.  2. Her EKG suggestive of possible prior anterior myocardial infarction, we will schedule echocardiogram to evaluate for systolic and diastolic function.  3. Hypertension - her blood pressures controlled I would continue the same management.  4. Hyperlipidemia, she is tolerating Crestor 20 mg daily while I agree  with this management and would continue.  5. Obesity encouraged to continue exercising.  Medication Adjustments/Labs and Tests Ordered: Current medicines are reviewed at length with the patient today.  Concerns regarding medicines are outlined above.  Medication changes, Labs and Tests ordered today are listed in the Patient Instructions below. Patient Instructions  Medication Instructions:   Your physician recommends that you continue on your current medications as directed. Please refer to the Current Medication list given to you today.    Testing/Procedures:  Your physician has requested that you have an echocardiogram. Echocardiography is a painless test that uses sound waves to create images of your heart.  It provides your doctor with information about the size and shape of your heart and how well your heart's chambers and valves are working. This procedure takes approximately one hour. There are no restrictions for this procedure.   Your physician has requested that you have a lexiscan myoview. For further information please visit [email or web address]. Please follow instruction sheet, as given.    Follow-Up:  AT DR Taylar Hartsough'S NEXT AVAILABLE APPOINTMENT       If you need a refill on your cardiac medications before your next appointment, please call your pharmacy.     Signed, Ena Dawley, MD  11/28/2015 8:53 AM    [address], [place], [place]  [address] Phone: [phone]; Fax: [phone]

## 2015-12-11 ENCOUNTER — Telehealth (HOSPITAL_COMMUNITY): Payer: Self-pay | Admitting: *Deleted

## 2015-12-11 NOTE — Telephone Encounter (Signed)
Patient given detailed instructions per Myocardial Perfusion Study Information Sheet for the test on 12/16/15 Patient notified to arrive 15 minutes early and that it is imperative to arrive on time for appointment to keep from having the test rescheduled.  If you need to cancel or reschedule your appointment, please call the office within 24 hours of your appointment. Failure to do so may result in a cancellation of your appointment, and a $50 no show fee. Patient verbalized understanding. Hubbard Robinson, RN

## 2015-12-16 ENCOUNTER — Other Ambulatory Visit (HOSPITAL_COMMUNITY): Payer: Self-pay

## 2015-12-16 ENCOUNTER — Ambulatory Visit (HOSPITAL_BASED_OUTPATIENT_CLINIC_OR_DEPARTMENT_OTHER): Payer: Commercial Managed Care - HMO

## 2015-12-16 ENCOUNTER — Ambulatory Visit (HOSPITAL_COMMUNITY): Payer: Commercial Managed Care - HMO | Attending: Cardiology

## 2015-12-16 DIAGNOSIS — I119 Hypertensive heart disease without heart failure: Secondary | ICD-10-CM | POA: Insufficient documentation

## 2015-12-16 DIAGNOSIS — E785 Hyperlipidemia, unspecified: Secondary | ICD-10-CM | POA: Diagnosis not present

## 2015-12-16 DIAGNOSIS — E119 Type 2 diabetes mellitus without complications: Secondary | ICD-10-CM | POA: Diagnosis not present

## 2015-12-16 DIAGNOSIS — R072 Precordial pain: Secondary | ICD-10-CM

## 2015-12-16 DIAGNOSIS — E669 Obesity, unspecified: Secondary | ICD-10-CM | POA: Insufficient documentation

## 2015-12-16 DIAGNOSIS — Z6832 Body mass index (BMI) 32.0-32.9, adult: Secondary | ICD-10-CM | POA: Diagnosis not present

## 2015-12-16 LAB — MYOCARDIAL PERFUSION IMAGING
LV dias vol: 65 mL (ref 46–106)
LV sys vol: 21 mL
Peak HR: 109 {beats}/min
RATE: 0.28
Rest HR: 81 {beats}/min
SDS: 8
SRS: 9
SSS: 17
TID: 0.88

## 2015-12-16 LAB — ECHOCARDIOGRAM COMPLETE

## 2015-12-16 MED ORDER — PERFLUTREN LIPID MICROSPHERE
1.0000 mL | INTRAVENOUS | Status: AC | PRN
  Administered 2015-12-16: 3 mL via INTRAVENOUS

## 2015-12-16 MED ORDER — REGADENOSON 0.4 MG/5ML IV SOLN
0.4000 mg | Freq: Once | INTRAVENOUS | Status: AC
  Administered 2015-12-16: 0.4 mg via INTRAVENOUS

## 2015-12-16 MED ORDER — TECHNETIUM TC 99M TETROFOSMIN IV KIT
10.2000 | PACK | Freq: Once | INTRAVENOUS | Status: AC | PRN
  Administered 2015-12-16: 10 via INTRAVENOUS
  Filled 2015-12-16: qty 10

## 2015-12-16 MED ORDER — TECHNETIUM TC 99M TETROFOSMIN IV KIT
33.0000 | PACK | Freq: Once | INTRAVENOUS | Status: AC | PRN
  Administered 2015-12-16: 33 via INTRAVENOUS
  Filled 2015-12-16: qty 33

## 2016-01-10 DIAGNOSIS — G4733 Obstructive sleep apnea (adult) (pediatric): Secondary | ICD-10-CM | POA: Diagnosis not present

## 2016-01-15 DIAGNOSIS — I10 Essential (primary) hypertension: Secondary | ICD-10-CM | POA: Diagnosis not present

## 2016-01-15 DIAGNOSIS — E1129 Type 2 diabetes mellitus with other diabetic kidney complication: Secondary | ICD-10-CM | POA: Diagnosis not present

## 2016-01-15 DIAGNOSIS — Z6832 Body mass index (BMI) 32.0-32.9, adult: Secondary | ICD-10-CM | POA: Diagnosis not present

## 2016-01-17 DIAGNOSIS — R8299 Other abnormal findings in urine: Secondary | ICD-10-CM | POA: Diagnosis not present

## 2016-01-17 DIAGNOSIS — E784 Other hyperlipidemia: Secondary | ICD-10-CM | POA: Diagnosis not present

## 2016-01-17 DIAGNOSIS — E1129 Type 2 diabetes mellitus with other diabetic kidney complication: Secondary | ICD-10-CM | POA: Diagnosis not present

## 2016-01-17 DIAGNOSIS — I10 Essential (primary) hypertension: Secondary | ICD-10-CM | POA: Diagnosis not present

## 2016-01-17 DIAGNOSIS — N390 Urinary tract infection, site not specified: Secondary | ICD-10-CM | POA: Diagnosis not present

## 2016-01-29 DIAGNOSIS — K219 Gastro-esophageal reflux disease without esophagitis: Secondary | ICD-10-CM | POA: Diagnosis not present

## 2016-01-29 DIAGNOSIS — Z0000 Encounter for general adult medical examination without abnormal findings: Secondary | ICD-10-CM | POA: Diagnosis not present

## 2016-01-29 DIAGNOSIS — N183 Chronic kidney disease, stage 3 (moderate): Secondary | ICD-10-CM | POA: Diagnosis not present

## 2016-01-29 DIAGNOSIS — G4733 Obstructive sleep apnea (adult) (pediatric): Secondary | ICD-10-CM | POA: Diagnosis not present

## 2016-01-29 DIAGNOSIS — E784 Other hyperlipidemia: Secondary | ICD-10-CM | POA: Diagnosis not present

## 2016-01-29 DIAGNOSIS — D72829 Elevated white blood cell count, unspecified: Secondary | ICD-10-CM | POA: Diagnosis not present

## 2016-01-29 DIAGNOSIS — I10 Essential (primary) hypertension: Secondary | ICD-10-CM | POA: Diagnosis not present

## 2016-01-29 DIAGNOSIS — E1129 Type 2 diabetes mellitus with other diabetic kidney complication: Secondary | ICD-10-CM | POA: Diagnosis not present

## 2016-01-29 DIAGNOSIS — F329 Major depressive disorder, single episode, unspecified: Secondary | ICD-10-CM | POA: Diagnosis not present

## 2016-02-03 DIAGNOSIS — Z1212 Encounter for screening for malignant neoplasm of rectum: Secondary | ICD-10-CM | POA: Diagnosis not present

## 2016-02-10 DIAGNOSIS — F329 Major depressive disorder, single episode, unspecified: Secondary | ICD-10-CM | POA: Diagnosis not present

## 2016-02-19 ENCOUNTER — Ambulatory Visit (INDEPENDENT_AMBULATORY_CARE_PROVIDER_SITE_OTHER): Payer: Commercial Managed Care - HMO | Admitting: Cardiology

## 2016-02-19 ENCOUNTER — Encounter (INDEPENDENT_AMBULATORY_CARE_PROVIDER_SITE_OTHER): Payer: Self-pay

## 2016-02-19 VITALS — BP 126/72 | HR 80 | Ht 63.0 in | Wt 178.0 lb

## 2016-02-19 DIAGNOSIS — E782 Mixed hyperlipidemia: Secondary | ICD-10-CM | POA: Diagnosis not present

## 2016-02-19 DIAGNOSIS — R072 Precordial pain: Secondary | ICD-10-CM

## 2016-02-19 DIAGNOSIS — I119 Hypertensive heart disease without heart failure: Secondary | ICD-10-CM | POA: Diagnosis not present

## 2016-02-19 NOTE — Progress Notes (Addendum)
Cardiology Office Note    Date:  02/19/2016   ID:  Wendy Fitzpatrick, DOB 12-21-1940, MRN [id number]  PCP:  Marton Redwood, MD  Cardiologist:  Ena Dawley, MD  Referring physician: Dr. Marton Redwood Chief complaint: 2 months follow up  History of Present Illness:  Wendy Fitzpatrick is a 75 y.o. female who is a very pleasant patient with prior medical history of hypertension, hyperlipidemia and diabetes mellitus that was diagnosed 2 years ago on a note therapy, obesity who is coming with complaints of intermittent chest pain. She's been experiencing this pain for several years, and went to the ER several times with negative enzymes and unremarkable EKG and was discharged home. She describes her pain as left-sided located in her left breast and in her chest wall with no radiation, lites intermittent can last up to 2 hours and is not associated with shortness of breath or dizziness. It is not related to physical activity, she goes to the [facility] several times a week to do water aerobics however it is associated with emotional or physical stress.   She hasn't had a stress test done in the last few years however many years ago it was negative. She has been treated with Crestor 20 mg daily for her hyperlipidemia and tolerates it well. She also has history of obstructive sleep apnea on CPAP, and hiatal hernia on treatment with PPIs. She has a remote history of cardiac catheterization 2008 that showed clean coronaries. She is married, her husband is also patient of mine. She has never smoked doesn't use alcohol, in her family there is no history of prior sudden cardiac death or coronary artery disease. Her post parents died of old age. Her siblings don't have any cardiac disease. She has occasional palpitations that only last a few seconds with no symptoms of presyncope or syncope. 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 However no syncope.  Occasional lower extremity edema when not compliant with her diet and no orthopnea or paroxysmal nocturnal dyspnea.  02/19/16 - 2 months follow up, normal stress test. She states that she feels great, she loves water aerobics that she attends 2-3/week at [facility]. Denies CP or DOE. She has lost 10 lbs. No palpitations or syncope.  Past Medical History:  Diagnosis Date  . Anxiety and depression   . Degenerative joint disease   . Diverticulosis   . GERD (gastroesophageal reflux disease)   . Hyperlipidemia   . Hypertension   . Migraine   . Obesity   . OSA on CPAP 02/14/2014  . Tubular adenoma    Past Surgical History:  Procedure Laterality Date  . APPENDECTOMY    . CYSTOCELE REPAIR     with rectocele repair  . KNEE SURGERY     left  . OVARIAN CYST REMOVAL    . RECTOCELE REPAIR     with cystocele repair  . TONSILLECTOMY AND ADENOIDECTOMY    . TOTAL ABDOMINAL HYSTERECTOMY     Current Medications: Outpatient Medications Prior to Visit  Medication Sig Dispense Refill  . cetirizine (ZYRTEC) 10 MG tablet Take 10 mg by mouth daily as needed for allergies.    . DULoxetine (CYMBALTA) 60 MG capsule Take 60 mg by mouth at bedtime.     . furosemide (LASIX) 20 MG tablet Take 20 mg by mouth daily as needed.     . hydrochlorothiazide (HYDRODIURIL) 25 MG tablet Take 25 mg by  mouth daily.    . pantoprazole (PROTONIX) 40 MG tablet Take 1 tablet by mouth 2 (two) times daily.    . rosuvastatin (CRESTOR) 20 MG tablet Take 1 tablet by mouth daily.    . sodium chloride (OCEAN) 0.65 % SOLN nasal spray Place 1 spray into both nostrils as needed for congestion.    Marland Kitchen diltiazem (CARDIZEM CD) 360 MG 24 hr capsule Take 360 mg by mouth daily.    . fexofenadine (ALLEGRA) 180 MG tablet Take 180 mg by mouth daily as needed for allergies or rhinitis.    . ranitidine (ZANTAC) 150 MG tablet Take 150 mg by mouth 2 (two) times daily.     No facility-administered medications prior to visit.     Allergies:    Adenosine and Pristiq [desvenlafaxine succinate er]   Social History   Social History  . Marital status: Married    Spouse name: N/A  . Number of children: N/A  . Years of education: N/A   Occupational History  . nurse    Social History Main Topics  . Smoking status: Never Smoker  . Smokeless tobacco: Former Systems developer  . Alcohol use 0.6 oz/week    1 Glasses of wine per week  . Drug use: No  . Sexual activity: Not on file   Other Topics Concern  . Not on file   Social History Narrative  . No narrative on file    Family History:  The patient's family history includes Colon cancer in her father; Dementia in her mother; Hypertension in her mother; Transient ischemic attack in her mother.   ROS:   Please see the history of present illness.    ROS All other systems reviewed and are negative.   PHYSICAL EXAM:    VS:  BP 126/72   Pulse 80   Ht 5\' 3"  (1.6 m)   Wt 178 lb (80.7 kg)   BMI 31.53 kg/m    GEN: Well nourished, well developed, in no acute distress  HEENT: normal  Neck: no JVD, carotid bruits, or masses Cardiac: RRR; no murmurs, rubs, or gallops,no edema  Respiratory:  clear to auscultation bilaterally, normal work of breathing GI: soft, nontender, nondistended, + BS MS: no deformity or atrophy  Skin: warm and dry, no rash Neuro:  Alert and Oriented x 3, Strength and sensation are intact Psych: euthymic mood, full affect  Wt Readings from Last 3 Encounters:  02/19/16 178 lb (80.7 kg)  11/28/15 183 lb (83 kg)  02/14/14 201 lb (91.2 kg)    Nuclear stress test: 12/16/2015  Nuclear stress EF: 64%.  There was no ST segment deviation noted during stress.  This is a low risk study.  The left ventricular ejection fraction is normal (55-65%). 1. Fixed small, mild apical anterior perfusion defect. Given normal wall motion, suspect this represents soft tissue attenuation.  No ischemia.  2. Normal LV systolic function.  3. Low risk study.   TTE: 12/16/2015 - Left  ventricle: The cavity size was normal. There was mild   concentric hypertrophy. Systolic function was normal. The   estimated ejection fraction was in the range of 55% to 60%. Wall   motion was normal; there were no regional wall motion   abnormalities. Doppler parameters are consistent with abnormal   left ventricular relaxation (grade 1 diastolic dysfunction). No   evidence of thrombus. Acoustic contrast opacification revealed no   evidence ofthrombus.    Studies/Labs Reviewed:   EKG:  EKG is ordered today.  The  ekg ordered today demonstrates Sinus rhythm, cannot rule out prior anterior MI age undetermined abnormal EKG.  Recent Labs: No results found for requested labs within last 8760 hours.   Lipid Panel No results found for: CHOL, TRIG, HDL, CHOLHDL, VLDL, LDLCALC, LDLDIRECT   ASSESSMENT:    No diagnosis found.   PLAN:  In order of problems listed above:  1. Atypical chest pain - negative stress test.  2. Hypertensive heart disease without CHF - HTN now controlled, continue the same management.  3. Hyperlipidemia, she is tolerating Crestor 20 mg daily while I agree with this management and would continue.  4. Obesity - encouraged to continue exercising, loosing weight while exercising at [facility].  Medication Adjustments/Labs and Tests Ordered: Current medicines are reviewed at length with the patient today.  Concerns regarding medicines are outlined above.  Medication changes, Labs and Tests ordered today are listed in the Patient Instructions below. Patient Instructions  Your physician recommends that you continue on your current medications as directed. Please refer to the Current Medication list given to you today. Your physician wants you to follow-up in: 1 year with Dr. Meda Coffee.  You will receive a reminder letter in the mail two months in advance. If you don't receive a letter, please call our office to schedule the follow-up appointment.   Signed, Ena Dawley, MD  02/19/2016 5:37 PM    [address], [place], [place]  [address] Phone: [phone]; Fax: [phone]

## 2016-02-19 NOTE — Patient Instructions (Signed)
Your physician recommends that you continue on your current medications as directed. Please refer to the Current Medication list given to you today.  Your physician wants you to follow-up in: 1 year with Dr. Nelson. You will receive a reminder letter in the mail two months in advance. If you don't receive a letter, please call our office to schedule the follow-up appointment.  

## 2016-02-28 DIAGNOSIS — F329 Major depressive disorder, single episode, unspecified: Secondary | ICD-10-CM | POA: Diagnosis not present

## 2016-03-24 DIAGNOSIS — R8299 Other abnormal findings in urine: Secondary | ICD-10-CM | POA: Diagnosis not present

## 2016-03-24 DIAGNOSIS — Z6832 Body mass index (BMI) 32.0-32.9, adult: Secondary | ICD-10-CM | POA: Diagnosis not present

## 2016-03-24 DIAGNOSIS — N390 Urinary tract infection, site not specified: Secondary | ICD-10-CM | POA: Diagnosis not present

## 2016-03-24 DIAGNOSIS — M546 Pain in thoracic spine: Secondary | ICD-10-CM | POA: Diagnosis not present

## 2016-03-25 DIAGNOSIS — M546 Pain in thoracic spine: Secondary | ICD-10-CM | POA: Diagnosis not present

## 2016-03-25 DIAGNOSIS — K449 Diaphragmatic hernia without obstruction or gangrene: Secondary | ICD-10-CM | POA: Diagnosis not present

## 2016-03-31 DIAGNOSIS — F329 Major depressive disorder, single episode, unspecified: Secondary | ICD-10-CM | POA: Diagnosis not present

## 2016-04-14 DIAGNOSIS — G4733 Obstructive sleep apnea (adult) (pediatric): Secondary | ICD-10-CM | POA: Diagnosis not present

## 2016-04-20 DIAGNOSIS — Z1231 Encounter for screening mammogram for malignant neoplasm of breast: Secondary | ICD-10-CM | POA: Diagnosis not present

## 2016-04-30 DIAGNOSIS — M25562 Pain in left knee: Secondary | ICD-10-CM | POA: Diagnosis not present

## 2016-04-30 DIAGNOSIS — M1712 Unilateral primary osteoarthritis, left knee: Secondary | ICD-10-CM | POA: Diagnosis not present

## 2016-04-30 DIAGNOSIS — M2342 Loose body in knee, left knee: Secondary | ICD-10-CM | POA: Diagnosis not present

## 2016-04-30 DIAGNOSIS — G8929 Other chronic pain: Secondary | ICD-10-CM | POA: Diagnosis not present

## 2016-05-29 DIAGNOSIS — J029 Acute pharyngitis, unspecified: Secondary | ICD-10-CM | POA: Diagnosis not present

## 2016-05-29 DIAGNOSIS — J209 Acute bronchitis, unspecified: Secondary | ICD-10-CM | POA: Diagnosis not present

## 2016-05-29 DIAGNOSIS — R05 Cough: Secondary | ICD-10-CM | POA: Diagnosis not present

## 2016-05-29 DIAGNOSIS — R6883 Chills (without fever): Secondary | ICD-10-CM | POA: Diagnosis not present

## 2016-05-29 DIAGNOSIS — Z6831 Body mass index (BMI) 31.0-31.9, adult: Secondary | ICD-10-CM | POA: Diagnosis not present

## 2016-06-05 DIAGNOSIS — I10 Essential (primary) hypertension: Secondary | ICD-10-CM | POA: Diagnosis not present

## 2016-06-05 DIAGNOSIS — E785 Hyperlipidemia, unspecified: Secondary | ICD-10-CM | POA: Diagnosis not present

## 2016-06-05 DIAGNOSIS — Z6831 Body mass index (BMI) 31.0-31.9, adult: Secondary | ICD-10-CM | POA: Diagnosis not present

## 2016-06-05 DIAGNOSIS — E1129 Type 2 diabetes mellitus with other diabetic kidney complication: Secondary | ICD-10-CM | POA: Diagnosis not present

## 2016-06-05 DIAGNOSIS — H9190 Unspecified hearing loss, unspecified ear: Secondary | ICD-10-CM | POA: Diagnosis not present

## 2016-06-05 DIAGNOSIS — D72829 Elevated white blood cell count, unspecified: Secondary | ICD-10-CM | POA: Diagnosis not present

## 2016-07-20 DIAGNOSIS — G4733 Obstructive sleep apnea (adult) (pediatric): Secondary | ICD-10-CM | POA: Diagnosis not present

## 2016-07-21 DIAGNOSIS — M1712 Unilateral primary osteoarthritis, left knee: Secondary | ICD-10-CM | POA: Diagnosis not present

## 2016-09-23 DIAGNOSIS — Z961 Presence of intraocular lens: Secondary | ICD-10-CM | POA: Diagnosis not present

## 2016-09-23 DIAGNOSIS — H5213 Myopia, bilateral: Secondary | ICD-10-CM | POA: Diagnosis not present

## 2016-09-23 DIAGNOSIS — H52203 Unspecified astigmatism, bilateral: Secondary | ICD-10-CM | POA: Diagnosis not present

## 2016-09-23 DIAGNOSIS — H524 Presbyopia: Secondary | ICD-10-CM | POA: Diagnosis not present

## 2016-09-23 DIAGNOSIS — E119 Type 2 diabetes mellitus without complications: Secondary | ICD-10-CM | POA: Diagnosis not present

## 2016-09-23 DIAGNOSIS — D2311 Other benign neoplasm of skin of right eyelid, including canthus: Secondary | ICD-10-CM | POA: Diagnosis not present

## 2016-10-07 DIAGNOSIS — I10 Essential (primary) hypertension: Secondary | ICD-10-CM | POA: Diagnosis not present

## 2016-10-07 DIAGNOSIS — F3289 Other specified depressive episodes: Secondary | ICD-10-CM | POA: Diagnosis not present

## 2016-10-07 DIAGNOSIS — Z1389 Encounter for screening for other disorder: Secondary | ICD-10-CM | POA: Diagnosis not present

## 2016-10-07 DIAGNOSIS — E784 Other hyperlipidemia: Secondary | ICD-10-CM | POA: Diagnosis not present

## 2016-10-07 DIAGNOSIS — N183 Chronic kidney disease, stage 3 (moderate): Secondary | ICD-10-CM | POA: Diagnosis not present

## 2016-10-07 DIAGNOSIS — E1129 Type 2 diabetes mellitus with other diabetic kidney complication: Secondary | ICD-10-CM | POA: Diagnosis not present

## 2016-10-07 DIAGNOSIS — Z6831 Body mass index (BMI) 31.0-31.9, adult: Secondary | ICD-10-CM | POA: Diagnosis not present

## 2016-10-13 DIAGNOSIS — K317 Polyp of stomach and duodenum: Secondary | ICD-10-CM | POA: Diagnosis not present

## 2016-10-13 DIAGNOSIS — Z800 Family history of malignant neoplasm of digestive organs: Secondary | ICD-10-CM | POA: Diagnosis not present

## 2016-10-13 DIAGNOSIS — Z86010 Personal history of colonic polyps: Secondary | ICD-10-CM | POA: Diagnosis not present

## 2016-10-13 DIAGNOSIS — K449 Diaphragmatic hernia without obstruction or gangrene: Secondary | ICD-10-CM | POA: Diagnosis not present

## 2016-10-14 DIAGNOSIS — F329 Major depressive disorder, single episode, unspecified: Secondary | ICD-10-CM | POA: Diagnosis not present

## 2016-10-20 DIAGNOSIS — G4733 Obstructive sleep apnea (adult) (pediatric): Secondary | ICD-10-CM | POA: Diagnosis not present

## 2016-11-21 DIAGNOSIS — R748 Abnormal levels of other serum enzymes: Secondary | ICD-10-CM | POA: Diagnosis not present

## 2016-11-21 DIAGNOSIS — N179 Acute kidney failure, unspecified: Secondary | ICD-10-CM | POA: Diagnosis not present

## 2016-11-21 DIAGNOSIS — R109 Unspecified abdominal pain: Secondary | ICD-10-CM | POA: Diagnosis not present

## 2016-11-21 DIAGNOSIS — Z9989 Dependence on other enabling machines and devices: Secondary | ICD-10-CM | POA: Diagnosis not present

## 2016-11-21 DIAGNOSIS — D734 Cyst of spleen: Secondary | ICD-10-CM | POA: Diagnosis not present

## 2016-11-21 DIAGNOSIS — G4733 Obstructive sleep apnea (adult) (pediatric): Secondary | ICD-10-CM | POA: Diagnosis not present

## 2016-11-21 DIAGNOSIS — K219 Gastro-esophageal reflux disease without esophagitis: Secondary | ICD-10-CM | POA: Diagnosis not present

## 2016-11-21 DIAGNOSIS — E119 Type 2 diabetes mellitus without complications: Secondary | ICD-10-CM | POA: Diagnosis not present

## 2016-11-21 DIAGNOSIS — K5229 Other allergic and dietetic gastroenteritis and colitis: Secondary | ICD-10-CM | POA: Diagnosis not present

## 2016-11-21 DIAGNOSIS — E860 Dehydration: Secondary | ICD-10-CM | POA: Diagnosis not present

## 2016-11-21 DIAGNOSIS — I10 Essential (primary) hypertension: Secondary | ICD-10-CM | POA: Diagnosis not present

## 2016-11-21 DIAGNOSIS — E875 Hyperkalemia: Secondary | ICD-10-CM | POA: Diagnosis not present

## 2016-11-21 DIAGNOSIS — R197 Diarrhea, unspecified: Secondary | ICD-10-CM | POA: Diagnosis not present

## 2016-11-22 DIAGNOSIS — N179 Acute kidney failure, unspecified: Secondary | ICD-10-CM | POA: Diagnosis not present

## 2016-11-22 DIAGNOSIS — E875 Hyperkalemia: Secondary | ICD-10-CM | POA: Diagnosis not present

## 2016-11-22 DIAGNOSIS — E860 Dehydration: Secondary | ICD-10-CM | POA: Diagnosis not present

## 2016-11-22 DIAGNOSIS — E119 Type 2 diabetes mellitus without complications: Secondary | ICD-10-CM | POA: Diagnosis not present

## 2016-11-22 DIAGNOSIS — R197 Diarrhea, unspecified: Secondary | ICD-10-CM | POA: Diagnosis not present

## 2016-11-22 DIAGNOSIS — R109 Unspecified abdominal pain: Secondary | ICD-10-CM | POA: Diagnosis not present

## 2016-11-23 DIAGNOSIS — R109 Unspecified abdominal pain: Secondary | ICD-10-CM | POA: Diagnosis not present

## 2016-11-23 DIAGNOSIS — E119 Type 2 diabetes mellitus without complications: Secondary | ICD-10-CM | POA: Diagnosis not present

## 2016-11-23 DIAGNOSIS — E875 Hyperkalemia: Secondary | ICD-10-CM | POA: Diagnosis not present

## 2016-11-23 DIAGNOSIS — R197 Diarrhea, unspecified: Secondary | ICD-10-CM | POA: Diagnosis not present

## 2016-11-23 DIAGNOSIS — E860 Dehydration: Secondary | ICD-10-CM | POA: Diagnosis not present

## 2016-11-23 DIAGNOSIS — N179 Acute kidney failure, unspecified: Secondary | ICD-10-CM | POA: Diagnosis not present

## 2016-11-24 DIAGNOSIS — D649 Anemia, unspecified: Secondary | ICD-10-CM | POA: Diagnosis not present

## 2016-11-24 DIAGNOSIS — Z6831 Body mass index (BMI) 31.0-31.9, adult: Secondary | ICD-10-CM | POA: Diagnosis not present

## 2016-11-24 DIAGNOSIS — F3289 Other specified depressive episodes: Secondary | ICD-10-CM | POA: Diagnosis not present

## 2016-11-24 DIAGNOSIS — E1129 Type 2 diabetes mellitus with other diabetic kidney complication: Secondary | ICD-10-CM | POA: Diagnosis not present

## 2016-11-24 DIAGNOSIS — R1084 Generalized abdominal pain: Secondary | ICD-10-CM | POA: Diagnosis not present

## 2016-11-24 DIAGNOSIS — I10 Essential (primary) hypertension: Secondary | ICD-10-CM | POA: Diagnosis not present

## 2016-11-24 DIAGNOSIS — E872 Acidosis: Secondary | ICD-10-CM | POA: Diagnosis not present

## 2016-11-24 DIAGNOSIS — N179 Acute kidney failure, unspecified: Secondary | ICD-10-CM | POA: Diagnosis not present

## 2016-11-25 DIAGNOSIS — K449 Diaphragmatic hernia without obstruction or gangrene: Secondary | ICD-10-CM | POA: Diagnosis not present

## 2016-11-25 DIAGNOSIS — D124 Benign neoplasm of descending colon: Secondary | ICD-10-CM | POA: Diagnosis not present

## 2016-11-25 DIAGNOSIS — K2970 Gastritis, unspecified, without bleeding: Secondary | ICD-10-CM | POA: Diagnosis not present

## 2016-11-25 DIAGNOSIS — K2900 Acute gastritis without bleeding: Secondary | ICD-10-CM | POA: Diagnosis not present

## 2016-11-25 DIAGNOSIS — Z1211 Encounter for screening for malignant neoplasm of colon: Secondary | ICD-10-CM | POA: Diagnosis not present

## 2016-11-25 DIAGNOSIS — Z86010 Personal history of colonic polyps: Secondary | ICD-10-CM | POA: Diagnosis not present

## 2016-11-25 DIAGNOSIS — E119 Type 2 diabetes mellitus without complications: Secondary | ICD-10-CM | POA: Diagnosis not present

## 2016-11-25 DIAGNOSIS — K648 Other hemorrhoids: Secondary | ICD-10-CM | POA: Diagnosis not present

## 2016-11-25 DIAGNOSIS — K317 Polyp of stomach and duodenum: Secondary | ICD-10-CM | POA: Diagnosis not present

## 2016-11-25 DIAGNOSIS — K635 Polyp of colon: Secondary | ICD-10-CM | POA: Diagnosis not present

## 2016-11-25 DIAGNOSIS — K5730 Diverticulosis of large intestine without perforation or abscess without bleeding: Secondary | ICD-10-CM | POA: Diagnosis not present

## 2016-11-25 DIAGNOSIS — D131 Benign neoplasm of stomach: Secondary | ICD-10-CM | POA: Diagnosis not present

## 2016-11-25 DIAGNOSIS — Z800 Family history of malignant neoplasm of digestive organs: Secondary | ICD-10-CM | POA: Diagnosis not present

## 2016-11-25 HISTORY — PX: COLONOSCOPY: SHX174

## 2016-12-01 DIAGNOSIS — F329 Major depressive disorder, single episode, unspecified: Secondary | ICD-10-CM | POA: Diagnosis not present

## 2016-12-21 DIAGNOSIS — E872 Acidosis: Secondary | ICD-10-CM | POA: Diagnosis not present

## 2016-12-21 DIAGNOSIS — R1084 Generalized abdominal pain: Secondary | ICD-10-CM | POA: Diagnosis not present

## 2016-12-21 DIAGNOSIS — I10 Essential (primary) hypertension: Secondary | ICD-10-CM | POA: Diagnosis not present

## 2016-12-21 DIAGNOSIS — N179 Acute kidney failure, unspecified: Secondary | ICD-10-CM | POA: Diagnosis not present

## 2016-12-21 DIAGNOSIS — Z6831 Body mass index (BMI) 31.0-31.9, adult: Secondary | ICD-10-CM | POA: Diagnosis not present

## 2016-12-21 DIAGNOSIS — E1129 Type 2 diabetes mellitus with other diabetic kidney complication: Secondary | ICD-10-CM | POA: Diagnosis not present

## 2016-12-21 DIAGNOSIS — F3289 Other specified depressive episodes: Secondary | ICD-10-CM | POA: Diagnosis not present

## 2016-12-21 DIAGNOSIS — D649 Anemia, unspecified: Secondary | ICD-10-CM | POA: Diagnosis not present

## 2016-12-29 DIAGNOSIS — D649 Anemia, unspecified: Secondary | ICD-10-CM | POA: Diagnosis not present

## 2016-12-29 DIAGNOSIS — I10 Essential (primary) hypertension: Secondary | ICD-10-CM | POA: Diagnosis not present

## 2016-12-29 DIAGNOSIS — K581 Irritable bowel syndrome with constipation: Secondary | ICD-10-CM | POA: Diagnosis not present

## 2016-12-29 DIAGNOSIS — E784 Other hyperlipidemia: Secondary | ICD-10-CM | POA: Diagnosis not present

## 2016-12-29 DIAGNOSIS — R1011 Right upper quadrant pain: Secondary | ICD-10-CM | POA: Diagnosis not present

## 2016-12-29 DIAGNOSIS — Z6832 Body mass index (BMI) 32.0-32.9, adult: Secondary | ICD-10-CM | POA: Diagnosis not present

## 2016-12-29 DIAGNOSIS — N179 Acute kidney failure, unspecified: Secondary | ICD-10-CM | POA: Diagnosis not present

## 2016-12-29 DIAGNOSIS — E1129 Type 2 diabetes mellitus with other diabetic kidney complication: Secondary | ICD-10-CM | POA: Diagnosis not present

## 2017-01-21 DIAGNOSIS — G4733 Obstructive sleep apnea (adult) (pediatric): Secondary | ICD-10-CM | POA: Diagnosis not present

## 2017-02-26 DIAGNOSIS — N390 Urinary tract infection, site not specified: Secondary | ICD-10-CM | POA: Diagnosis not present

## 2017-02-26 DIAGNOSIS — E7849 Other hyperlipidemia: Secondary | ICD-10-CM | POA: Diagnosis not present

## 2017-02-26 DIAGNOSIS — E1129 Type 2 diabetes mellitus with other diabetic kidney complication: Secondary | ICD-10-CM | POA: Diagnosis not present

## 2017-02-26 DIAGNOSIS — Z0000 Encounter for general adult medical examination without abnormal findings: Secondary | ICD-10-CM | POA: Diagnosis not present

## 2017-02-26 DIAGNOSIS — I10 Essential (primary) hypertension: Secondary | ICD-10-CM | POA: Diagnosis not present

## 2017-03-05 DIAGNOSIS — E7849 Other hyperlipidemia: Secondary | ICD-10-CM | POA: Diagnosis not present

## 2017-03-05 DIAGNOSIS — G4733 Obstructive sleep apnea (adult) (pediatric): Secondary | ICD-10-CM | POA: Diagnosis not present

## 2017-03-05 DIAGNOSIS — D649 Anemia, unspecified: Secondary | ICD-10-CM | POA: Diagnosis not present

## 2017-03-05 DIAGNOSIS — Z0000 Encounter for general adult medical examination without abnormal findings: Secondary | ICD-10-CM | POA: Diagnosis not present

## 2017-03-05 DIAGNOSIS — F329 Major depressive disorder, single episode, unspecified: Secondary | ICD-10-CM | POA: Diagnosis not present

## 2017-03-05 DIAGNOSIS — Z1389 Encounter for screening for other disorder: Secondary | ICD-10-CM | POA: Diagnosis not present

## 2017-03-05 DIAGNOSIS — E1129 Type 2 diabetes mellitus with other diabetic kidney complication: Secondary | ICD-10-CM | POA: Diagnosis not present

## 2017-03-05 DIAGNOSIS — I10 Essential (primary) hypertension: Secondary | ICD-10-CM | POA: Diagnosis not present

## 2017-03-12 DIAGNOSIS — Z1212 Encounter for screening for malignant neoplasm of rectum: Secondary | ICD-10-CM | POA: Diagnosis not present

## 2017-04-06 ENCOUNTER — Encounter: Payer: Self-pay | Admitting: Dietician

## 2017-04-06 ENCOUNTER — Encounter: Payer: Medicare HMO | Attending: Internal Medicine | Admitting: Dietician

## 2017-04-06 DIAGNOSIS — E669 Obesity, unspecified: Secondary | ICD-10-CM | POA: Insufficient documentation

## 2017-04-06 DIAGNOSIS — Z713 Dietary counseling and surveillance: Secondary | ICD-10-CM | POA: Insufficient documentation

## 2017-04-06 DIAGNOSIS — Z6833 Body mass index (BMI) 33.0-33.9, adult: Secondary | ICD-10-CM | POA: Diagnosis not present

## 2017-04-06 DIAGNOSIS — E119 Type 2 diabetes mellitus without complications: Secondary | ICD-10-CM | POA: Insufficient documentation

## 2017-04-06 NOTE — Patient Instructions (Signed)
Resume Mindful eating  Mindful choices-continue label reading.  Eat slowly, stop when you are satisfied.  Eat a snack if you are hungry (small portion, on a plate, enjoy without distraction)  If you are not hungry for a snack, what are other options that you enjoy.  Continue your active lifestyle.  Aim for 2-3 Carb Choices per meal (30-45 grams) +/- 1 either way  Aim for 0-1 Carbs per snack if hungry  Include protein in moderation with your meals and snacks Consider reading food labels for Total Carbohydrate and Fat Grams of foods Consider checking BG at alternate times per day as directed by MD  Continue taking medication as directed by MD

## 2017-04-06 NOTE — Progress Notes (Signed)
Diabetes Self-Management Education  Visit Type: First/Initial  Appt. Start Time: 1415 Appt. End Time: 5329  04/06/2017  [name], identified by name and date of birth, is a 76 y.o. female with a diagnosis of Diabetes: Type 2. Other history includes HTN, hyperlipidemia, GERD, Anxiety, OSA on C-pap.  Labs include EGFR 40 02/26/17 which patient states was after a keto diet. Patient states that she lost 25 lbs to 165 lbs as well in the past by adjusting portions and bing more mindful along with swimming.  She has since regained to 179 lbs.  She would like to lose further weight.  Patient lives with her husband.  They share shopping and cooking. She is a retired Therapist, sports.  They enjoy going to the [facility] for water classes 3 times per week although lately, patient states that she has been increasingly tired.  ASSESSMENT  Height 5' 1" (1.549 m), weight 179 lb (81.2 kg). Body mass index is 33.82 kg/m.  Diabetes Self-Management Education - 04/06/17 1428      Visit Information   Visit Type  First/Initial      Initial Visit   Diabetes Type  Type 2    Are you currently following a meal plan?  Yes    What type of meal plan do you follow?  low carbohydrate, low sugar, high protein    Are you taking your medications as prescribed?  Yes    Date Diagnosed  2016      Health Coping   How would you rate your overall health?  Good      Psychosocial Assessment   Patient Belief/Attitude about Diabetes  Motivated to manage diabetes    Self-care barriers  None    Self-management support  Doctor's office;Family    Other persons present  Patient;Spouse/SO    Patient Concerns  Weight Control    Special Needs  None    Preferred Learning Style  No preference indicated    Learning Readiness  Ready    How often do you need to have someone help you when you read instructions, pamphlets, or other written materials from your doctor or pharmacy?  1 - Never    What is the last grade level you completed in  school?  A.D. Nursing      Pre-Education Assessment   Patient understands the diabetes disease and treatment process.  Needs Review    Patient understands incorporating nutritional management into lifestyle.  Needs Review    Patient undertands incorporating physical activity into lifestyle.  Needs Review    Patient understands using medications safely.  Needs Review    Patient understands monitoring blood glucose, interpreting and using results  Needs Review    Patient understands prevention, detection, and treatment of acute complications.  Needs Review    Patient understands prevention, detection, and treatment of chronic complications.  Needs Review    Patient understands how to develop strategies to address psychosocial issues.  Needs Review    Patient understands how to develop strategies to promote health/change behavior.  Needs Review      Complications   Last HgB A1C per patient/outside source  6 % 2 weeks ago10/12/18    How often do you check your blood sugar?  1-2 times/day    Fasting Blood glucose range (mg/dL)  70-129    Number of hypoglycemic episodes per month  0    Number of hyperglycemic episodes per week  0    Have you had a dilated eye exam in the  past 12 months?  Yes    Have you had a dental exam in the past 12 months?  Yes    Are you checking your feet?  Yes    How many days per week are you checking your feet?  3      Dietary Intake   Breakfast  omelet, spinach and other vegetables, toast with butter or dry, fruit, coffee (black) 7:30-8:30    Snack (morning)  fruit (fresh pear, apple, orange or grapes) OR peanut butter and apple    Lunch  45 calorie bread for [place] sandwich OR salad with meat    Snack (afternoon)  apple with peanut butter    Dinner  meat, 1-2 vegetables, starch (rare instant mashed potatoes) 5-6    Snack (evening)  banana or clementine plus other items. gets the most hungry at night    Beverage(s)  decaf tea, water, occasional diet soda, black  coffee, water with lemon      Exercise   Exercise Type  Moderate (swimming / aerobic walking)    How many days per week to you exercise?  3    How many minutes per day do you exercise?  60    Total minutes per week of exercise  180      Patient Education   Previous Diabetes Education  Yes (please comment) 2016    Nutrition management   Food label reading, portion sizes and measuring food.;Meal options for control of blood glucose level and chronic complications.;Information on hints to eating out and maintain blood glucose control.;Carbohydrate counting;Role of diet in the treatment of diabetes and the relationship between the three main macronutrients and blood glucose level    Physical activity and exercise   Role of exercise on diabetes management, blood pressure control and cardiac health.;Identified with patient nutritional and/or medication changes necessary with exercise.    Medications  Reviewed medication adjustment guidelines for hyperglycemia and sick days.    Monitoring  Daily foot exams    Acute complications  Taught treatment of hypoglycemia - the 15 rule.      Individualized Goals (developed by patient)   Nutrition  Follow meal plan discussed    Physical Activity  Exercise 3-5 times per week;60 minutes per day    Medications  take my medication as prescribed    Monitoring   test my blood glucose as discussed      Post-Education Assessment   Patient understands the diabetes disease and treatment process.  Demonstrates understanding / competency    Patient understands incorporating nutritional management into lifestyle.  Demonstrates understanding / competency    Patient undertands incorporating physical activity into lifestyle.  Demonstrates understanding / competency    Patient understands monitoring blood glucose, interpreting and using results  Demonstrates understanding / competency    Patient understands prevention, detection, and treatment of acute complications.   Demonstrates understanding / competency    Patient understands prevention, detection, and treatment of chronic complications.  Demonstrates understanding / competency    Patient understands how to develop strategies to address psychosocial issues.  Demonstrates understanding / competency    Patient understands how to develop strategies to promote health/change behavior.  Demonstrates understanding / competency      Outcomes   Expected Outcomes  Demonstrated interest in learning. Expect positive outcomes    Future DMSE  PRN    Program Status  Completed       Individualized Plan for Diabetes Self-Management Training:   Learning Objective:  Patient will have  a greater understanding of diabetes self-management. Patient education plan is to attend individual and/or group sessions per assessed needs and concerns.   Plan:   Patient Instructions  Resume Mindful eating  Mindful choices-continue label reading.  Eat slowly, stop when you are satisfied.  Eat a snack if you are hungry (small portion, on a plate, enjoy without distraction)  If you are not hungry for a snack, what are other options that you enjoy.  Continue your active lifestyle.  Aim for 2-3 Carb Choices per meal (30-45 grams) +/- 1 either way  Aim for 0-1 Carbs per snack if hungry  Include protein in moderation with your meals and snacks Consider reading food labels for Total Carbohydrate and Fat Grams of foods Consider checking BG at alternate times per day as directed by MD  Continue taking medication as directed by MD       Expected Outcomes:  Demonstrated interest in learning. Expect positive outcomes  Education material provided: Living Well with Diabetes, Food label handouts, A1C conversion sheet, Meal plan card, My Plate and Snack sheet  If problems or questions, patient to contact team via:  Phone  Future DSME appointment: PRN

## 2017-04-23 DIAGNOSIS — G4733 Obstructive sleep apnea (adult) (pediatric): Secondary | ICD-10-CM | POA: Diagnosis not present

## 2017-05-24 DIAGNOSIS — R51 Headache: Secondary | ICD-10-CM | POA: Diagnosis not present

## 2017-05-24 DIAGNOSIS — J0100 Acute maxillary sinusitis, unspecified: Secondary | ICD-10-CM | POA: Diagnosis not present

## 2017-05-24 DIAGNOSIS — Z6834 Body mass index (BMI) 34.0-34.9, adult: Secondary | ICD-10-CM | POA: Diagnosis not present

## 2017-06-23 DIAGNOSIS — R51 Headache: Secondary | ICD-10-CM | POA: Diagnosis not present

## 2017-06-23 DIAGNOSIS — Z6833 Body mass index (BMI) 33.0-33.9, adult: Secondary | ICD-10-CM | POA: Diagnosis not present

## 2017-06-23 DIAGNOSIS — I10 Essential (primary) hypertension: Secondary | ICD-10-CM | POA: Diagnosis not present

## 2017-06-23 DIAGNOSIS — G4733 Obstructive sleep apnea (adult) (pediatric): Secondary | ICD-10-CM | POA: Diagnosis not present

## 2017-07-11 DIAGNOSIS — N390 Urinary tract infection, site not specified: Secondary | ICD-10-CM | POA: Diagnosis not present

## 2017-07-22 DIAGNOSIS — G4733 Obstructive sleep apnea (adult) (pediatric): Secondary | ICD-10-CM | POA: Diagnosis not present

## 2017-08-11 DIAGNOSIS — N183 Chronic kidney disease, stage 3 (moderate): Secondary | ICD-10-CM | POA: Diagnosis not present

## 2017-08-13 ENCOUNTER — Other Ambulatory Visit: Payer: Self-pay | Admitting: Internal Medicine

## 2017-08-13 DIAGNOSIS — N179 Acute kidney failure, unspecified: Secondary | ICD-10-CM

## 2017-08-16 ENCOUNTER — Ambulatory Visit
Admission: RE | Admit: 2017-08-16 | Discharge: 2017-08-16 | Disposition: A | Discharge status: Home / Self Care | Payer: Medicare HMO | Source: Ambulatory Visit | Attending: Internal Medicine | Admitting: Internal Medicine

## 2017-08-16 DIAGNOSIS — N179 Acute kidney failure, unspecified: Secondary | ICD-10-CM | POA: Diagnosis not present

## 2017-08-20 DIAGNOSIS — N390 Urinary tract infection, site not specified: Secondary | ICD-10-CM | POA: Diagnosis not present

## 2017-08-20 DIAGNOSIS — Z6833 Body mass index (BMI) 33.0-33.9, adult: Secondary | ICD-10-CM | POA: Diagnosis not present

## 2017-08-20 DIAGNOSIS — I10 Essential (primary) hypertension: Secondary | ICD-10-CM | POA: Diagnosis not present

## 2017-08-20 DIAGNOSIS — E7849 Other hyperlipidemia: Secondary | ICD-10-CM | POA: Diagnosis not present

## 2017-08-20 DIAGNOSIS — N179 Acute kidney failure, unspecified: Secondary | ICD-10-CM | POA: Diagnosis not present

## 2017-08-20 DIAGNOSIS — E1129 Type 2 diabetes mellitus with other diabetic kidney complication: Secondary | ICD-10-CM | POA: Diagnosis not present

## 2017-10-22 DIAGNOSIS — G4733 Obstructive sleep apnea (adult) (pediatric): Secondary | ICD-10-CM | POA: Diagnosis not present

## 2017-11-17 DIAGNOSIS — I10 Essential (primary) hypertension: Secondary | ICD-10-CM | POA: Diagnosis not present

## 2017-11-25 DIAGNOSIS — F3289 Other specified depressive episodes: Secondary | ICD-10-CM | POA: Diagnosis not present

## 2017-11-25 DIAGNOSIS — E7849 Other hyperlipidemia: Secondary | ICD-10-CM | POA: Diagnosis not present

## 2017-11-25 DIAGNOSIS — I10 Essential (primary) hypertension: Secondary | ICD-10-CM | POA: Diagnosis not present

## 2017-11-25 DIAGNOSIS — D6489 Other specified anemias: Secondary | ICD-10-CM | POA: Diagnosis not present

## 2017-11-25 DIAGNOSIS — Z6832 Body mass index (BMI) 32.0-32.9, adult: Secondary | ICD-10-CM | POA: Diagnosis not present

## 2017-11-25 DIAGNOSIS — E1129 Type 2 diabetes mellitus with other diabetic kidney complication: Secondary | ICD-10-CM | POA: Diagnosis not present

## 2018-02-09 DIAGNOSIS — Z6832 Body mass index (BMI) 32.0-32.9, adult: Secondary | ICD-10-CM | POA: Diagnosis not present

## 2018-02-09 DIAGNOSIS — M25562 Pain in left knee: Secondary | ICD-10-CM | POA: Diagnosis not present

## 2018-02-09 DIAGNOSIS — Z23 Encounter for immunization: Secondary | ICD-10-CM | POA: Diagnosis not present

## 2018-03-01 DIAGNOSIS — R05 Cough: Secondary | ICD-10-CM | POA: Diagnosis not present

## 2018-03-01 DIAGNOSIS — Z6831 Body mass index (BMI) 31.0-31.9, adult: Secondary | ICD-10-CM | POA: Diagnosis not present

## 2018-03-01 DIAGNOSIS — J111 Influenza due to unidentified influenza virus with other respiratory manifestations: Secondary | ICD-10-CM | POA: Diagnosis not present

## 2018-03-04 IMAGING — NM NM MISC PROCEDURE
3 series · 18 of 18 positions shown · non-contrast
Comparison: none

[Series 1: wbr_s-proj_st stress_(id)_sa · 6.5mm · 6.51mm/px · 6 of 64 frames shown (1 of 2)]
[frame 6/64]
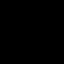
[frame 16/64]
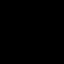
[frame 27/64]
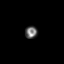
[frame 38/64]
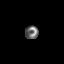
[frame 48/64]
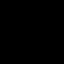
[frame 59/64]
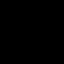

[Series 1: wbr_s-proj_st stress_(id)_sa · 6.5mm · 6.51mm/px · 6 of 512 frames shown (2 of 2)]
[frame 43/512]
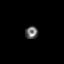
[frame 128/512]
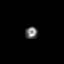
[frame 214/512]
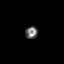
[frame 299/512]
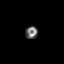
[frame 384/512]
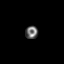
[frame 470/512]
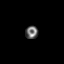

[Series 1: wbr_r-proj_st rest_(id)_sa · 6.5mm · 6.51mm/px · 6 of 64 frames shown]
[frame 6/64]
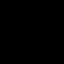
[frame 16/64]
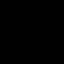
[frame 27/64]
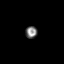
[frame 38/64]
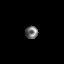
[frame 48/64]
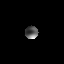
[frame 59/64]
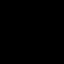

[18 of 18 positions shown; findings below may reference images not displayed]

Canned report from images found in remote index.

Refer to host system for actual result text.

## 2018-03-28 ENCOUNTER — Encounter: Payer: Self-pay | Admitting: Gastroenterology

## 2018-03-28 DIAGNOSIS — Z780 Asymptomatic menopausal state: Secondary | ICD-10-CM | POA: Diagnosis not present

## 2018-03-28 DIAGNOSIS — I10 Essential (primary) hypertension: Secondary | ICD-10-CM | POA: Diagnosis not present

## 2018-03-28 DIAGNOSIS — E7849 Other hyperlipidemia: Secondary | ICD-10-CM | POA: Diagnosis not present

## 2018-03-28 DIAGNOSIS — Z13820 Encounter for screening for osteoporosis: Secondary | ICD-10-CM | POA: Diagnosis not present

## 2018-03-28 DIAGNOSIS — E1129 Type 2 diabetes mellitus with other diabetic kidney complication: Secondary | ICD-10-CM | POA: Diagnosis not present

## 2018-03-29 DIAGNOSIS — R82998 Other abnormal findings in urine: Secondary | ICD-10-CM | POA: Diagnosis not present

## 2018-03-29 DIAGNOSIS — N183 Chronic kidney disease, stage 3 (moderate): Secondary | ICD-10-CM | POA: Diagnosis not present

## 2018-03-29 DIAGNOSIS — E1129 Type 2 diabetes mellitus with other diabetic kidney complication: Secondary | ICD-10-CM | POA: Diagnosis not present

## 2018-03-29 DIAGNOSIS — Z86010 Personal history of colonic polyps: Secondary | ICD-10-CM | POA: Diagnosis not present

## 2018-03-29 DIAGNOSIS — I10 Essential (primary) hypertension: Secondary | ICD-10-CM | POA: Diagnosis not present

## 2018-03-29 DIAGNOSIS — K219 Gastro-esophageal reflux disease without esophagitis: Secondary | ICD-10-CM | POA: Diagnosis not present

## 2018-03-29 DIAGNOSIS — E7849 Other hyperlipidemia: Secondary | ICD-10-CM | POA: Diagnosis not present

## 2018-03-29 DIAGNOSIS — F3341 Major depressive disorder, recurrent, in partial remission: Secondary | ICD-10-CM | POA: Diagnosis not present

## 2018-03-29 DIAGNOSIS — D649 Anemia, unspecified: Secondary | ICD-10-CM | POA: Diagnosis not present

## 2018-03-29 DIAGNOSIS — Z0000 Encounter for general adult medical examination without abnormal findings: Secondary | ICD-10-CM | POA: Diagnosis not present

## 2018-04-01 DIAGNOSIS — Z1212 Encounter for screening for malignant neoplasm of rectum: Secondary | ICD-10-CM | POA: Diagnosis not present

## 2018-04-05 ENCOUNTER — Encounter: Payer: Self-pay | Admitting: Gastroenterology

## 2018-04-06 DIAGNOSIS — D649 Anemia, unspecified: Secondary | ICD-10-CM | POA: Diagnosis not present

## 2018-04-08 ENCOUNTER — Encounter: Payer: Self-pay | Admitting: Gastroenterology

## 2018-04-12 ENCOUNTER — Ambulatory Visit: Payer: Medicare HMO | Admitting: Gastroenterology

## 2018-04-12 ENCOUNTER — Encounter: Payer: Self-pay | Admitting: Gastroenterology

## 2018-04-12 VITALS — BP 126/72 | HR 90 | Ht 61.0 in | Wt 165.5 lb

## 2018-04-12 DIAGNOSIS — R634 Abnormal weight loss: Secondary | ICD-10-CM | POA: Diagnosis not present

## 2018-04-12 DIAGNOSIS — D509 Iron deficiency anemia, unspecified: Secondary | ICD-10-CM

## 2018-04-12 DIAGNOSIS — R195 Other fecal abnormalities: Secondary | ICD-10-CM

## 2018-04-12 DIAGNOSIS — R1030 Lower abdominal pain, unspecified: Secondary | ICD-10-CM | POA: Diagnosis not present

## 2018-04-12 MED ORDER — PANTOPRAZOLE SODIUM 40 MG PO TBEC: 40.0000 mg | DELAYED_RELEASE_TABLET | Freq: Every day | ORAL | 11 refills | Status: DC

## 2018-04-12 MED ORDER — SUPREP BOWEL PREP KIT 17.5-3.13-1.6 GM/177ML PO SOLN: 1.0000 | ORAL | 0 refills | Status: DC

## 2018-04-12 NOTE — Patient Instructions (Signed)
If you are age 75 or older, your body mass index should be between 23-30. Your Body mass index is 31.27 kg/m. If this is out of the aforementioned range listed, please consider follow up with your Primary Care Provider.  If you are age 74 or younger, your body mass index should be between 19-25. Your Body mass index is 31.27 kg/m. If this is out of the aformentioned range listed, please consider follow up with your Primary Care Provider.   You have been scheduled for a CT scan of the abdomen and pelvis at [facility].   You are scheduled on 04/19/18 at 10:30am. You should arrive 15 minutes prior to your appointment time for registration. Please follow the written instructions below on the day of your exam:  WARNING: IF YOU ARE ALLERGIC TO IODINE/X-RAY DYE, PLEASE NOTIFY RADIOLOGY IMMEDIATELY AT [phone]! YOU WILL BE GIVEN A 13 HOUR PREMEDICATION PREP.  1) Do not eat or drink anything after 6:30am (4 hours prior to your test) 2) You have been given 2 bottles of oral contrast to drink. The solution may taste better if refrigerated, but do NOT add ice or any other liquid to this solution. Shake well before drinking.    Drink 1 bottle of contrast @ 8:30am (2 hours prior to your exam)  Drink 1 bottle of contrast @ 9:30am (1 hour prior to your exam)  You may take any medications as prescribed with a small amount of water, if necessary. If you take any of the following medications: METFORMIN, GLUCOPHAGE, GLUCOVANCE, AVANDAMET, RIOMET, FORTAMET, Brandon MET, JANUMET, GLUMETZA or METAGLIP, you MAY be asked to HOLD this medication 48 hours AFTER the exam.  The purpose of you drinking the oral contrast is to aid in the visualization of your intestinal tract. The contrast solution may cause some diarrhea. Depending on your individual set of symptoms, you may also receive an intravenous injection of x-ray contrast/dye. Plan on being at [facility] for 30 minutes or longer, depending on  the type of exam you are having performed.  This test typically takes 30-45 minutes to complete.  If you have any questions regarding your exam or if you need to reschedule, you may call the CT department at [phone] between the hours of 8:00 am and 5:00 pm, Monday-Friday.  ________________________________________________________________________  Dennis Bast have been scheduled for an endoscopy and colonoscopy. Please follow the written instructions given to you at your visit today. Please pick up your prep supplies at the pharmacy within the next 1-3 days. If you use inhalers (even only as needed), please bring them with you on the day of your procedure. Your physician has requested that you go to www.startemmi.com and enter the access code given to you at your visit today. This web site gives a general overview about your procedure. However, you should still follow specific instructions given to you by our office regarding your preparation for the procedure.  We have sent the following medications to your pharmacy for you to pick up at your convenience: Protonix 40 mg once daily.   Thank you,  Dr. Jackquline Denmark

## 2018-04-12 NOTE — Progress Notes (Signed)
Chief Complaint:   Referring Provider:  Marton Redwood, MD      ASSESSMENT AND PLAN;   #1. IDA with heme + stools. Hb 10.3 MCV 93 (03/2018), Nl BUN/Cr 30/1.2, iron saturation 10%, TIBC 404, iron 44 with ferritin 7.4.  Nl B12 (03/2018). Colon 11/2016-small TA s/p polypectomy, mild sig div. EGD 11/2016-large hiatal hernia, 20-25 gastric polyps, no residual antral polyp (Bx-hyperplastic gastric polyps). HH, gastric polyps may contribute to IDA. #2. Lower abdonimal pain. #3. Wt loss - Nl TSH. #4. H/O large gastric polyp s/p endoscopic resection at [facility] followed by post polypectomy bleeding requiring endoscopic hemostasis 03/2015. #5. GERD with large HH. #6. Family history of colon cancer (dad>60)  Plan: -Proceed with CT scan of the abdomen and pelvis with p.o. and IV contrast. -She will also be scheduled for EGD and colonoscopy.  I have discussed the risks & benefits.  The risks including risk of perforation requiring laparotomy, bleeding after biopsies/polypectomy requiring blood transfusion and risks of anesthesia/sedation were discussed.  Rare risks of missing UGI and colorectal neoplasms were also discussed.  Alternatives were also given.  Patient is fully aware and agrees to proceed. All the questions were answered. Procedures will be scheduled in upcoming days.  Patient is to report immediately if there is any significant weight loss or excessive bleeding until then. Consent forms given for review. -Continue iron. -Decrease Protonix to 40 mg p.o. once a day. -If she continues to have iron deficiency anemia and the above work-up is negative, consider capsule endoscopy.    HPI:    Wendy Fitzpatrick is a 77 y.o. female  Eddie Dibbles died due to massive MI 8 months ago Wendy Fitzpatrick has been feeling quite down and has not been eating very well. She has lost 15 pounds over the last 1 year. Has been having lower abdominal crampy pain mostly in the left lower quadrant without any fever or chills. She has  previous history of constipation but is currently having intermittent diarrhea. Had few episodes of rectal bleeding without any perirectal pain. Has been feeling extremely fatigued. Denies having any significant odynophagia or dysphagia.  No heartburn. Denies excessive use of nonsteroidals. Has been started on iron supplements which has turned the stool dark. He was also found to have heme positive stools. Has been advised to get repeat GI evaluation.  Wt loss Past Medical History:  Diagnosis Date  . Anemia   . Anxiety and depression   . Arthritis   . Degenerative joint disease   . Diabetes ([place])    type 2  . Diverticulosis   . GERD (gastroesophageal reflux disease)   . Hyperlipidemia   . Hypertension   . IBS (irritable bowel syndrome)   . Kidney stones   . Migraine   . Obesity   . OSA on CPAP 02/14/2014  . Tubular adenoma   . Umbilical hernia    had a repair    Past Surgical History:  Procedure Laterality Date  . APPENDECTOMY    . CHOLECYSTECTOMY    . COLONOSCOPY  11/25/2016   Colonic polyp status post polypectomy. Mild sigmoid diverticulosis.   . CYSTOCELE REPAIR     with rectocele repair  . ESOPHAGOGASTRODUODENOSCOPY    . KNEE SURGERY     left  . OVARIAN CYST REMOVAL    . RECTOCELE REPAIR     with cystocele repair  . TONSILLECTOMY AND ADENOIDECTOMY    . TOTAL ABDOMINAL HYSTERECTOMY    . UMBILICAL HERNIA REPAIR  Family History  Problem Relation Age of Onset  . Hypertension Mother   . Dementia Mother   . Transient ischemic attack Mother   . Colon cancer Father   . Esophageal cancer Neg Hx   . Breast cancer Neg Hx     Social History   Tobacco Use  . Smoking status: Never Smoker  . Smokeless tobacco: Current User    Types: Snuff  Substance Use Topics  . Alcohol use: Yes    Alcohol/week: 1.0 standard drinks    Types: 1 Glasses of wine per week    Comment: rare  . Drug use: No    Current Outpatient Medications  Medication Sig Dispense Refill   . ALPRAZolam (XANAX) 0.25 MG tablet Take 0.125 mg by mouth as needed.    . AMBULATORY NON FORMULARY MEDICATION 1 tablet daily. calcuim magnesuim and zinc plus vitamin D3    . AMBULATORY NON FORMULARY MEDICATION 1 capsule daily. Vitamin code raw iron    . Biotin 10000 MCG TABS Take 1 tablet by mouth daily.    . cetirizine (ZYRTEC) 10 MG tablet Take 10 mg by mouth daily as needed for allergies.    . DULoxetine (CYMBALTA) 60 MG capsule Take 60 mg by mouth daily.     . fluticasone (FLONASE) 50 MCG/ACT nasal spray Place 1 spray into both nostrils as needed for allergies or rhinitis.    . furosemide (LASIX) 20 MG tablet Take 20 mg by mouth daily as needed.     . hydrochlorothiazide (HYDRODIURIL) 25 MG tablet Take 25 mg by mouth daily.    Marland Kitchen lisinopril (PRINIVIL,ZESTRIL) 20 MG tablet Take 1 tablet by mouth daily.    Marland Kitchen loratadine (CLARITIN) 10 MG tablet Take 10 mg by mouth as needed.     . Magnesium 400 MG CAPS Take 1 capsule by mouth 2 (two) times daily.    . metFORMIN (GLUCOPHAGE) 500 MG tablet Take 1,000 mg by mouth 2 (two) times daily with a meal.     . pantoprazole (PROTONIX) 40 MG tablet Take 1 tablet by mouth 2 (two) times daily.    . rosuvastatin (CRESTOR) 20 MG tablet Take 1 tablet by mouth daily.     No current facility-administered medications for this visit.     Allergies  Allergen Reactions  . Adenosine     Other reaction(s): Other (See Comments) Knocked pulse down to 30  . Pristiq [Desvenlafaxine Succinate Er]     Review of Systems:  Constitutional: Denies fever, chills, diaphoresis, appetite change and has fatigue.  HEENT: Denies photophobia, eye pain, redness, hearing loss, ear pain, congestion, sore throat, rhinorrhea, sneezing, mouth sores, neck pain, neck stiffness and tinnitus.   Respiratory: Denies SOB, DOE, cough, chest tightness,  and wheezing.   Cardiovascular: Denies chest pain, palpitations and leg swelling.  Genitourinary: Denies dysuria, urgency, frequency,  hematuria, flank pain and difficulty urinating.  Musculoskeletal: Have myalgias, back pain, joint swelling, arthralgias and gait problem.  Skin: No rash.  Neurological: Denies dizziness, seizures, syncope, weakness, light-headedness, numbness and headaches.  Hematological: Denies adenopathy. Easy bruising, personal or family bleeding history  Psychiatric/Behavioral: Has anxiety/depression/sleeping problems-lost her husband.     Physical Exam:    BP 126/72   Pulse 90   Ht 5\' 1"  (1.549 m)   Wt 165 lb 8 oz (75.1 kg)   BMI 31.27 kg/m  Filed Weights   04/12/18 1005  Weight: 165 lb 8 oz (75.1 kg)   Constitutional:  Well-developed, in no acute distress. Psychiatric: Normal  mood and affect. Behavior is normal. HEENT: Pupils normal.  Conjunctivae are normal. No scleral icterus. Neck supple.  Cardiovascular: Normal rate, regular rhythm. No edema Pulmonary/chest: Effort normal and breath sounds normal. No wheezing, rales or rhonchi. Abdominal: Soft, nondistended. Nontender. Bowel sounds active throughout. There are no masses palpable. No hepatomegaly. Rectal:  defered Neurological: Alert and oriented to person place and time. Skin: Skin is warm and dry. No rashes noted.  Data Reviewed: I have personally reviewed following labs and imaging studies I have reviewed the blood work performed on 03/28/2018.  It is summarized as above. 25 minutes spent with the patient today. Greater than 50% was spent in counseling and coordination of care with the patient   Carmell Austria, MD 04/12/2018, 10:29 AM  Cc: Marton Redwood, MD

## 2018-04-18 ENCOUNTER — Other Ambulatory Visit: Payer: Medicare HMO

## 2018-04-18 ENCOUNTER — Other Ambulatory Visit: Payer: Self-pay

## 2018-04-18 DIAGNOSIS — D509 Iron deficiency anemia, unspecified: Secondary | ICD-10-CM

## 2018-04-18 DIAGNOSIS — R195 Other fecal abnormalities: Secondary | ICD-10-CM

## 2018-04-18 DIAGNOSIS — R634 Abnormal weight loss: Secondary | ICD-10-CM

## 2018-04-18 DIAGNOSIS — R1030 Lower abdominal pain, unspecified: Secondary | ICD-10-CM

## 2018-04-19 ENCOUNTER — Ambulatory Visit (HOSPITAL_BASED_OUTPATIENT_CLINIC_OR_DEPARTMENT_OTHER)
Admission: RE | Admit: 2018-04-19 | Discharge: 2018-04-19 | Disposition: A | Discharge status: Home / Self Care | Payer: Medicare HMO | Source: Ambulatory Visit | Attending: Gastroenterology | Admitting: Gastroenterology

## 2018-04-19 ENCOUNTER — Encounter (HOSPITAL_BASED_OUTPATIENT_CLINIC_OR_DEPARTMENT_OTHER): Payer: Self-pay

## 2018-04-19 ENCOUNTER — Ambulatory Visit (HOSPITAL_BASED_OUTPATIENT_CLINIC_OR_DEPARTMENT_OTHER): Payer: Medicare HMO

## 2018-04-19 DIAGNOSIS — R1030 Lower abdominal pain, unspecified: Secondary | ICD-10-CM

## 2018-04-19 DIAGNOSIS — R195 Other fecal abnormalities: Secondary | ICD-10-CM | POA: Insufficient documentation

## 2018-04-19 DIAGNOSIS — D509 Iron deficiency anemia, unspecified: Secondary | ICD-10-CM | POA: Insufficient documentation

## 2018-04-19 DIAGNOSIS — K5730 Diverticulosis of large intestine without perforation or abscess without bleeding: Secondary | ICD-10-CM | POA: Diagnosis not present

## 2018-04-19 DIAGNOSIS — R634 Abnormal weight loss: Secondary | ICD-10-CM | POA: Diagnosis not present

## 2018-04-19 MED ORDER — IOPAMIDOL (ISOVUE-300) INJECTION 61%
100.0000 mL | Freq: Once | INTRAVENOUS | Status: AC | PRN
  Administered 2018-04-19: 80 mL via INTRAVENOUS

## 2018-04-21 ENCOUNTER — Telehealth: Payer: Self-pay | Admitting: Gastroenterology

## 2018-04-21 NOTE — Telephone Encounter (Signed)
Please see previous message-patient is requesting CT results finalized on 04/20/18

## 2018-04-22 ENCOUNTER — Other Ambulatory Visit: Payer: Self-pay

## 2018-04-22 DIAGNOSIS — R1030 Lower abdominal pain, unspecified: Secondary | ICD-10-CM

## 2018-04-22 DIAGNOSIS — R197 Diarrhea, unspecified: Secondary | ICD-10-CM

## 2018-04-22 MED ORDER — DICYCLOMINE HCL 10 MG PO CAPS: 10.0000 mg | ORAL_CAPSULE | Freq: Two times a day (BID) | ORAL | 4 refills | Status: AC

## 2018-04-22 NOTE — Progress Notes (Signed)
Rx submitted to pharmacy per MD orders;

## 2018-05-12 ENCOUNTER — Encounter: Payer: Self-pay | Admitting: Gastroenterology

## 2018-05-26 ENCOUNTER — Ambulatory Visit (AMBULATORY_SURGERY_CENTER): Payer: Medicare HMO | Admitting: Gastroenterology

## 2018-05-26 ENCOUNTER — Encounter: Payer: Self-pay | Admitting: Gastroenterology

## 2018-05-26 VITALS — BP 110/57 | HR 70 | Temp 98.7°F | Resp 13 | Ht 61.0 in | Wt 165.0 lb

## 2018-05-26 DIAGNOSIS — D509 Iron deficiency anemia, unspecified: Secondary | ICD-10-CM

## 2018-05-26 DIAGNOSIS — K219 Gastro-esophageal reflux disease without esophagitis: Secondary | ICD-10-CM | POA: Diagnosis not present

## 2018-05-26 DIAGNOSIS — K2970 Gastritis, unspecified, without bleeding: Secondary | ICD-10-CM

## 2018-05-26 DIAGNOSIS — K3189 Other diseases of stomach and duodenum: Secondary | ICD-10-CM

## 2018-05-26 DIAGNOSIS — K259 Gastric ulcer, unspecified as acute or chronic, without hemorrhage or perforation: Secondary | ICD-10-CM | POA: Diagnosis not present

## 2018-05-26 DIAGNOSIS — E119 Type 2 diabetes mellitus without complications: Secondary | ICD-10-CM | POA: Diagnosis not present

## 2018-05-26 DIAGNOSIS — K649 Unspecified hemorrhoids: Secondary | ICD-10-CM | POA: Diagnosis not present

## 2018-05-26 DIAGNOSIS — K5730 Diverticulosis of large intestine without perforation or abscess without bleeding: Secondary | ICD-10-CM | POA: Diagnosis not present

## 2018-05-26 DIAGNOSIS — G4733 Obstructive sleep apnea (adult) (pediatric): Secondary | ICD-10-CM | POA: Diagnosis not present

## 2018-05-26 DIAGNOSIS — R195 Other fecal abnormalities: Secondary | ICD-10-CM | POA: Diagnosis not present

## 2018-05-26 DIAGNOSIS — K317 Polyp of stomach and duodenum: Secondary | ICD-10-CM

## 2018-05-26 DIAGNOSIS — E785 Hyperlipidemia, unspecified: Secondary | ICD-10-CM | POA: Diagnosis not present

## 2018-05-26 DIAGNOSIS — R634 Abnormal weight loss: Secondary | ICD-10-CM | POA: Diagnosis not present

## 2018-05-26 DIAGNOSIS — I10 Essential (primary) hypertension: Secondary | ICD-10-CM | POA: Diagnosis not present

## 2018-05-26 MED ORDER — SODIUM CHLORIDE 0.9 % IV SOLN: 500.0000 mL | Freq: Once | INTRAVENOUS | Status: DC

## 2018-05-26 NOTE — Patient Instructions (Signed)
Handouts given for hemorrhoids, diverticulosis, gastritis and hiatal hernia  YOU HAD AN ENDOSCOPIC PROCEDURE TODAY AT [place]:   Refer to the procedure report that was given to you for any specific questions about what was found during the examination.  If the procedure report does not answer your questions, please call your gastroenterologist to clarify.  If you requested that your care partner not be given the details of your procedure findings, then the procedure report has been included in a sealed envelope for you to review at your convenience later.  YOU SHOULD EXPECT: Some feelings of bloating in the abdomen. Passage of more gas than usual.  Walking can help get rid of the air that was put into your GI tract during the procedure and reduce the bloating. If you had a lower endoscopy (such as a colonoscopy or flexible sigmoidoscopy) you may notice spotting of blood in your stool or on the toilet paper. If you underwent a bowel prep for your procedure, you may not have a normal bowel movement for a few days.  Please Note:  You might notice some irritation and congestion in your nose or some drainage.  This is from the oxygen used during your procedure.  There is no need for concern and it should clear up in a day or so.  SYMPTOMS TO REPORT IMMEDIATELY:   Following lower endoscopy (colonoscopy or flexible sigmoidoscopy):  Excessive amounts of blood in the stool  Significant tenderness or worsening of abdominal pains  Swelling of the abdomen that is new, acute  Fever of 100F or higher   Following upper endoscopy (EGD)  Vomiting of blood or coffee ground material  New chest pain or pain under the shoulder blades  Painful or persistently difficult swallowing  New shortness of breath  Fever of 100F or higher  Black, tarry-looking stools  For urgent or emergent issues, a gastroenterologist can be reached at any hour by calling [phone].   DIET:  We do recommend  a small meal at first, but then you may proceed to your regular diet.  Drink plenty of fluids but you should avoid alcoholic beverages for 24 hours.  ACTIVITY:  You should plan to take it easy for the rest of today and you should NOT DRIVE or use heavy machinery until tomorrow (because of the sedation medicines used during the test).    FOLLOW UP: Our staff will call the number listed on your records the next business day following your procedure to check on you and address any questions or concerns that you may have regarding the information given to you following your procedure. If we do not reach you, we will leave a message.  However, if you are feeling well and you are not experiencing any problems, there is no need to return our call.  We will assume that you have returned to your regular daily activities without incident.  If any biopsies were taken you will be contacted by phone or by letter within the next 1-3 weeks.  Please call us at [phone] if you have not heard about the biopsies in 3 weeks.    SIGNATURES/CONFIDENTIALITY: You and/or your care partner have signed paperwork which will be entered into your electronic medical record.  These signatures attest to the fact that that the information above on your After Visit Summary has been reviewed and is understood.  Full responsibility of the confidentiality of this discharge information lies with you and/or your care-partner.

## 2018-05-26 NOTE — Op Note (Addendum)
[place] Patient Name: Wendy Fitzpatrick Procedure Date: 05/26/2018 1:36 PM MRN: [id number] Endoscopist: Jackquline Denmark , MD Age: 78 Referring MD:  Date of Birth: 30-Jul-1940 Gender: Female Account #: [id number] Procedure:                Colonoscopy Indications:              Iron deficiency anemia with heme positive stools,                            family history of colon cancer in a first-degree                            relative. Medicines:                Monitored Anesthesia Care Procedure:                Pre-Anesthesia Assessment:                           - Prior to the procedure, a History and Physical                            was performed, and patient medications and                            allergies were reviewed. The patient's tolerance of                            previous anesthesia was also reviewed. The risks                            and benefits of the procedure and the sedation                            options and risks were discussed with the patient.                            All questions were answered, and informed consent                            was obtained. Prior Anticoagulants: The patient has                            taken no previous anticoagulant or antiplatelet                            agents. ASA Grade Assessment: III - A patient with                            severe systemic disease. After reviewing the risks                            and benefits, the patient was deemed in  satisfactory condition to undergo the procedure.                           After obtaining informed consent, the colonoscope                            was passed under direct vision. Throughout the                            procedure, the patient's blood pressure, pulse, and                            oxygen saturations were monitored continuously. The                            Model PCF-H190DL [id number]) scope was introduced                           through the anus and advanced to the 2 cm into the                            ileum. The colonoscopy was performed without                            difficulty. The patient tolerated the procedure                            well. The quality of the bowel preparation was good. Scope In: 1:51:41 PM Scope Out: 2:03:36 PM Scope Withdrawal Time: 0 hours 7 minutes 30 seconds  Total Procedure Duration: 0 hours 11 minutes 55 seconds  Findings:                 A few small-mouthed diverticula were found in the                            sigmoid colon and descending colon.                           Non-bleeding internal hemorrhoids were found during                            retroflexion. The hemorrhoids were small.                           The exam was otherwise without abnormality on                            direct and retroflexion views.                           The terminal ileum appeared normal. Complications:            No immediate complications. Estimated Blood Loss:     Estimated blood loss: none. Impression:               - Mild left colonic  diverticulosis.                           - Small internal hemorrhoids.                           - Otherwise normal colonoscopy to TI. Recommendation:           - Patient has a contact number available for                            emergencies. The signs and symptoms of potential                            delayed complications were discussed with the                            patient. Return to normal activities tomorrow.                            Written discharge instructions were provided to the                            patient.                           - Resume previous diet.                           - Continue present medications.                           - Return to GI clinic in 12 weeks. Jackquline Denmark, MD 05/26/2018 2:17:20 PM This report has been signed electronically.

## 2018-05-26 NOTE — Op Note (Signed)
[place] Patient Name: Wendy Fitzpatrick Procedure Date: 05/26/2018 1:36 PM MRN: [id number] Endoscopist: Jackquline Denmark , MD Age: 78 Referring MD:  Date of Birth: 1940/07/27 Gender: Female Account #: [id number] Procedure:                Upper GI endoscopy Indications:              Iron deficiency anemia, GERD, history of large                            gastric antral polyp status post resection at [facility] (Bx-hyperplastic) Medicines:                Monitored Anesthesia Care Procedure:                Pre-Anesthesia Assessment:                           - Prior to the procedure, a History and Physical                            was performed, and patient medications and                            allergies were reviewed. The patient's tolerance of                            previous anesthesia was also reviewed. The risks                            and benefits of the procedure and the sedation                            options and risks were discussed with the patient.                            All questions were answered, and informed consent                            was obtained. Prior Anticoagulants: The patient has                            taken no previous anticoagulant or antiplatelet                            agents. ASA Grade Assessment: III - A patient with                            severe systemic disease. After reviewing the risks                            and benefits, the patient was deemed in  satisfactory condition to undergo the procedure.                           After obtaining informed consent, the endoscope was                            passed under direct vision. Throughout the                            procedure, the patient's blood pressure, pulse, and                            oxygen saturations were monitored continuously. The                            Endoscope was introduced  through the mouth, and                            advanced to the second part of duodenum. The upper                            GI endoscopy was accomplished without difficulty.                            The patient tolerated the procedure well. Scope In: Scope Out: Findings:                 The distal esophagus was mildly tortuous d/t HH.                           A large hiatal hernia was present extending from 33                            cm to 40 cm.                           10-12 gastric polyps measuring 68mm to 1 cm were                            visualized in the body of the stomach. These were                            deemed to be hyperplastic previously. Hence, not                            removed. No bleeding. No residual antral polyp.                           Localized mild inflammation was found in the                            gastric antrum. Biopsies were taken with a cold  forceps for histology.                           The examined duodenum was normal. Biopsies for                            histology were taken with a cold forceps for                            evaluation of celiac disease. Complications:            No immediate complications. Estimated Blood Loss:     Estimated blood loss: none. Impression:               - Large hiatal hernia.                           - Mild Gastritis. Biopsied.                           - Incidental gastric polyps.                           - No Active bleeding/explanation of iron deficiency                            anemia. Recommendation:           - Patient has a contact number available for                            emergencies. The signs and symptoms of potential                            delayed complications were discussed with the                            patient. Return to normal activities tomorrow.                            Written discharge instructions were provided to the                             patient.                           - Resume previous diet.                           - Continue present medications.                           - Await pathology results.                           - Return to GI clinic in 12 weeks. Jackquline Denmark, MD 05/26/2018 2:13:31 PM This report has been signed electronically.

## 2018-05-26 NOTE — Progress Notes (Signed)
Report to PACU, RN, vss, BBS= Clear.  

## 2018-05-26 NOTE — Progress Notes (Signed)
Called to room to assist during endoscopic procedure.  Patient ID and intended procedure confirmed with present staff. Received instructions for my participation in the procedure from the performing physician.  

## 2018-05-27 ENCOUNTER — Telehealth: Payer: Self-pay | Admitting: *Deleted

## 2018-05-27 NOTE — Telephone Encounter (Signed)
  Follow up Call-  Call back number 05/26/2018  Post procedure Call Back phone  # [phone]  Permission to leave phone message Yes  Some recent data might be hidden     Patient questions:  Do you have a fever, pain , or abdominal swelling? No. Pain Score  0 *  Have you tolerated food without any problems? Yes.    Have you been able to return to your normal activities? Yes.    Do you have any questions about your discharge instructions: Diet   No. Medications  No. Follow up visit  No.  Do you have questions or concerns about your Care? No.  Actions: * If pain score is 4 or above: No action needed, pain <4.

## 2018-06-01 ENCOUNTER — Encounter: Payer: Self-pay | Admitting: Gastroenterology

## 2018-06-27 DIAGNOSIS — F3341 Major depressive disorder, recurrent, in partial remission: Secondary | ICD-10-CM | POA: Diagnosis not present

## 2018-08-03 DIAGNOSIS — Z961 Presence of intraocular lens: Secondary | ICD-10-CM | POA: Diagnosis not present

## 2018-08-03 DIAGNOSIS — E119 Type 2 diabetes mellitus without complications: Secondary | ICD-10-CM | POA: Diagnosis not present

## 2018-08-03 DIAGNOSIS — H5213 Myopia, bilateral: Secondary | ICD-10-CM | POA: Diagnosis not present

## 2018-08-03 DIAGNOSIS — H524 Presbyopia: Secondary | ICD-10-CM | POA: Diagnosis not present

## 2018-08-03 DIAGNOSIS — H52203 Unspecified astigmatism, bilateral: Secondary | ICD-10-CM | POA: Diagnosis not present

## 2018-09-27 DIAGNOSIS — D509 Iron deficiency anemia, unspecified: Secondary | ICD-10-CM | POA: Diagnosis not present

## 2018-09-27 DIAGNOSIS — K922 Gastrointestinal hemorrhage, unspecified: Secondary | ICD-10-CM | POA: Diagnosis not present

## 2018-09-27 DIAGNOSIS — D649 Anemia, unspecified: Secondary | ICD-10-CM | POA: Diagnosis not present

## 2018-09-28 ENCOUNTER — Telehealth: Payer: Self-pay | Admitting: Gastroenterology

## 2018-09-28 DIAGNOSIS — Z1212 Encounter for screening for malignant neoplasm of rectum: Secondary | ICD-10-CM | POA: Diagnosis not present

## 2018-09-28 DIAGNOSIS — D509 Iron deficiency anemia, unspecified: Secondary | ICD-10-CM | POA: Diagnosis not present

## 2018-09-28 DIAGNOSIS — E119 Type 2 diabetes mellitus without complications: Secondary | ICD-10-CM | POA: Diagnosis not present

## 2018-09-28 DIAGNOSIS — K449 Diaphragmatic hernia without obstruction or gangrene: Secondary | ICD-10-CM | POA: Diagnosis not present

## 2018-09-28 DIAGNOSIS — I10 Essential (primary) hypertension: Secondary | ICD-10-CM | POA: Diagnosis not present

## 2018-09-28 DIAGNOSIS — E785 Hyperlipidemia, unspecified: Secondary | ICD-10-CM | POA: Diagnosis not present

## 2018-09-28 DIAGNOSIS — K922 Gastrointestinal hemorrhage, unspecified: Secondary | ICD-10-CM | POA: Diagnosis not present

## 2018-09-28 DIAGNOSIS — R109 Unspecified abdominal pain: Secondary | ICD-10-CM | POA: Diagnosis not present

## 2018-09-28 NOTE — Telephone Encounter (Signed)
Patient is calling said that she went to her PCP today and they went her to follow up with Dr. Lyndel Safe for a possible upper GI bleed. Said that her hemoglobin is at 8.4 and that her PCP would fax over her labs. Patient would like to know if she should just have the virtual visit tomorrow that I have scheduled her for or is there anything else she should do.

## 2018-09-28 NOTE — Telephone Encounter (Signed)
I left a message that patient should keep the office visit for tomorrow.

## 2018-09-28 NOTE — Telephone Encounter (Signed)
Pt canceled the virtual visit and will go to the hospital.

## 2018-09-29 ENCOUNTER — Other Ambulatory Visit: Payer: Self-pay

## 2018-09-29 ENCOUNTER — Telehealth (INDEPENDENT_AMBULATORY_CARE_PROVIDER_SITE_OTHER): Payer: Medicare HMO | Admitting: Gastroenterology

## 2018-09-29 ENCOUNTER — Telehealth: Payer: Medicare HMO | Admitting: Gastroenterology

## 2018-09-29 ENCOUNTER — Encounter: Payer: Self-pay | Admitting: Gastroenterology

## 2018-09-29 VITALS — Ht 61.0 in | Wt 163.0 lb

## 2018-09-29 DIAGNOSIS — Z8719 Personal history of other diseases of the digestive system: Secondary | ICD-10-CM

## 2018-09-29 DIAGNOSIS — R195 Other fecal abnormalities: Secondary | ICD-10-CM

## 2018-09-29 DIAGNOSIS — D509 Iron deficiency anemia, unspecified: Secondary | ICD-10-CM

## 2018-09-29 DIAGNOSIS — K219 Gastro-esophageal reflux disease without esophagitis: Secondary | ICD-10-CM

## 2018-09-29 DIAGNOSIS — Z800 Family history of malignant neoplasm of digestive organs: Secondary | ICD-10-CM

## 2018-09-29 DIAGNOSIS — K449 Diaphragmatic hernia without obstruction or gangrene: Secondary | ICD-10-CM

## 2018-09-29 MED ORDER — FERUMOXYTOL INJECTION 510 MG/17 ML: 510.0000 mg | Freq: Once | INTRAVENOUS | 0 refills | Status: AC

## 2018-09-29 MED ORDER — PANTOPRAZOLE SODIUM 40 MG PO TBEC: 40.0000 mg | DELAYED_RELEASE_TABLET | Freq: Every day | ORAL | 11 refills | Status: DC

## 2018-09-29 NOTE — Progress Notes (Signed)
Chief Complaint: FU  Referring Provider:  Marton Redwood, MD      ASSESSMENT AND PLAN;   #1. IDA with heme + Fitzpatrick with neg EGD (except gastric polyps, large HH), neg colon x 2, neg CT abdo/pelvis 04/2018. Hb 10.3(03/2018) to 9.1 (09/2018), BUN/Cr 24/1.2.   #2. H/O large gastric polyp s/p endoscopic resection at [facility] followed by post polypectomy bleeding requiring endoscopic hemostasis 03/2015.  #3. GERD with large HH.  #4. FH of colon cancer (dad>60).  #5. Comorbid conditions: HTN, HLD, DM, OSA.  Plan: -Proceed with Capsule endoscopy.  Can restart p.o. iron supplements after capsule endoscopy. -CBC, CMP in 1 week. -IV ferraheme x 2 doses (pharmacy to dose). Prefers [place] if possible. -Decrease Protonix to 40 mg p.o. once a day -If VCE positive, push enteroscopy v/s DBE (double balloon). If still with problems and etiology is unclear, would consider hiatal hernia repair.  Certainly that will be the last resort. -Avoid nonsteroidals.    HPI:    Wendy Fitzpatrick is a 78 y.o. female    FU Wendy Fitzpatrick- cleared now. Seen in [facility] emergency room on 09/28/2018 (yesterday).  Hemoglobin was 9.1.  She had brown Fitzpatrick on rectal exam but heme positive.  Of note the patient has not been taking iron supplements at the present time.  No nonsteroidals.  She denies having any nausea, vomiting, heartburn, odynophagia or dysphagia.  She had some diarrhea which is currently better.  No abdominal pain.  No further weight loss.    Review of previous records:   - Colon 11/2016-neg. EGD 11/2016-large hiatal hernia, gastric polyps, no residual antral polyp, neg SB Bx for celiac (Bx-hyperplastic gastric polyps).  -Labs reviewed from 09/28/2018-hemoglobin 9.1, MCV 88, platelets 239, WBC count 6.6 BUN 34, creatinine 1.2, normal liver function tests, normal PT/INR  -CT abdo/pelvis with contrast: 04/19/2018 1. Stable exam. No acute findings in the abdomen or pelvis.  Specifically, no evidence for colonic mass by CT. 2. Mild left diverticulosis without diverticulitis. 3. Large hiatal hernia with approximately 50% of the stomach in the chest, stable. 4. Splenic and renal cysts. 5. Aortic Atherosclerois (ICD10-170.0)  - EGD 05/26/2018 Large hiatal hernia, mild gastritis, 10-12 gastric polyps measuring 8 mm to 1 cm.  -Colonoscopy 05/26/2018 Good preparation, mild left colonic diverticulosis, small internal hemorrhoids, otherwise normal to TI.    Past Medical History:  Diagnosis Date  . Allergy   . Anemia   . Anxiety and depression   . Arthritis   . Degenerative joint disease   . Diabetes ([place])    type 2  . Diverticulosis   . GERD (gastroesophageal reflux disease)   . Hyperlipidemia   . Hypertension   . IBS (irritable bowel syndrome)   . Kidney stones   . Migraine   . Obesity   . OSA on CPAP 02/14/2014  . Sleep apnea   . Stroke [facility])    history of TIA's 2016  . Tubular adenoma   . Umbilical hernia    had a repair    Past Surgical History:  Procedure Laterality Date  . APPENDECTOMY    . CHOLECYSTECTOMY    . COLONOSCOPY  11/25/2016   Colonic polyp status post polypectomy. Mild sigmoid diverticulosis.   . CYSTOCELE REPAIR     with rectocele repair  . ESOPHAGOGASTRODUODENOSCOPY    . KNEE SURGERY     left  . OVARIAN CYST REMOVAL    . RECTOCELE REPAIR     with cystocele repair  .  TONSILLECTOMY AND ADENOIDECTOMY    . TOTAL ABDOMINAL HYSTERECTOMY    . UMBILICAL HERNIA REPAIR      Family History  Problem Relation Age of Onset  . Hypertension Mother   . Dementia Mother   . Transient ischemic attack Mother   . Colon cancer Father   . Esophageal cancer Neg Hx   . Breast cancer Neg Hx   . Stomach cancer Neg Hx   . Rectal cancer Neg Hx     Social History   Tobacco Use  . Smoking status: Never Smoker  . Smokeless tobacco: Never Used  Substance Use Topics  . Alcohol use: Yes    Alcohol/week: 1.0 standard drinks    Types: 1  Glasses of wine per week    Comment: rare  . Drug use: No    Current Outpatient Medications  Medication Sig Dispense Refill  . ALPRAZolam (XANAX) 0.25 MG tablet Take 0.125 mg by mouth as needed.    . AMBULATORY NON FORMULARY MEDICATION 1 tablet daily. calcuim magnesuim and zinc plus vitamin D3    . AMBULATORY NON FORMULARY MEDICATION 1 capsule daily. Vitamin code raw iron    . DULoxetine (CYMBALTA) 60 MG capsule Take 60 mg by mouth daily.     . fluticasone (FLONASE) 50 MCG/ACT nasal spray Place 1 spray into both nostrils as needed for allergies or rhinitis.    . hydrochlorothiazide (HYDRODIURIL) 25 MG tablet Take 25 mg by mouth daily.    Marland Kitchen lisinopril (PRINIVIL,ZESTRIL) 20 MG tablet Take 1 tablet by mouth daily.    Marland Kitchen loratadine (CLARITIN) 10 MG tablet Take 10 mg by mouth as needed.     . Magnesium 400 MG CAPS Take 1 capsule by mouth 2 (two) times daily.    . metFORMIN (GLUCOPHAGE) 500 MG tablet Take 1,000 mg by mouth 2 (two) times daily with a meal.     . pantoprazole (PROTONIX) 40 MG tablet Take 1 tablet (40 mg total) by mouth daily. 30 tablet 11  . rosuvastatin (CRESTOR) 20 MG tablet Take 1 tablet by mouth daily.    Marland Kitchen dicyclomine (BENTYL) 10 MG capsule Take 1 capsule (10 mg total) by mouth 2 (two) times daily. Must take medication half an hour before lunch and half an hour before bedtime. (Patient not taking: Reported on 09/29/2018) 60 capsule 4  . furosemide (LASIX) 20 MG tablet Take 20 mg by mouth as needed.      No current facility-administered medications for this visit.     Allergies  Allergen Reactions  . Adenosine     Other reaction(s): Other (See Comments) Knocked pulse down to 30  . Pristiq [Desvenlafaxine Succinate Er]     Review of Systems:    lost her husband Wendy Fitzpatrick who may had massive MI last year.     Physical Exam:    Ht 5\' 1"  (1.549 m)   Wt 163 lb (73.9 kg)   BMI 30.80 kg/m  Filed Weights   09/29/18 1124  Weight: 163 lb (73.9 kg)  Not examined since  it was a tele-visit     This service was provided via telemedicine.  The patient was located at home.  The provider was located in office.  The patient did consent to this telephone visit and is aware of possible charges through their insurance for this visit.   Time spent on call/review of records/coordination of care: 25 min   Carmell Austria, MD 09/29/2018, 1:35 PM  Cc: Marton Redwood, MD

## 2018-09-29 NOTE — Patient Instructions (Signed)
If you are age 76 or older, your body mass index should be between 23-30. Your Body mass index is 30.8 kg/m. If this is out of the aforementioned range listed, please consider follow up with your Primary Care Provider.  If you are age 39 or younger, your body mass index should be between 19-25. Your Body mass index is 30.8 kg/m. If this is out of the aformentioned range listed, please consider follow up with your Primary Care Provider.   It has been recommended by your doctor to have a Video Capsule Endoscopy, you will be called with an appointment for this.  It has been recommended by your doctor to have IV Feraheme you will be contacted with an appointment for this,   Please call and make an appointment to have labs drawn in one week.   Decrease your Protonix to once daily.   Thank you,  Dr. Jackquline Denmark

## 2018-09-30 ENCOUNTER — Telehealth: Payer: Self-pay | Admitting: Gastroenterology

## 2018-09-30 DIAGNOSIS — D509 Iron deficiency anemia, unspecified: Secondary | ICD-10-CM | POA: Diagnosis not present

## 2018-09-30 NOTE — Telephone Encounter (Signed)
Pt called stating that she got lab results and her hemoglobin was down to 7.8. she was concerned going into the weekend without infusion that we were working on for her. Pt was advised that if she feels bad during the weekend to go to the ER.

## 2018-10-03 ENCOUNTER — Telehealth: Payer: Self-pay | Admitting: Gastroenterology

## 2018-10-03 NOTE — Telephone Encounter (Signed)
Patient called said that on her Virtual visit 5-14 with Dr. Lyndel Safe we would be contacting her to set a iron IV visit. She wants to let us know that she called her PCP doctor and they were able to sch her an apt already.

## 2018-10-03 NOTE — Telephone Encounter (Signed)
See alternate note  

## 2018-10-05 ENCOUNTER — Other Ambulatory Visit (INDEPENDENT_AMBULATORY_CARE_PROVIDER_SITE_OTHER): Payer: Medicare HMO

## 2018-10-05 DIAGNOSIS — Z800 Family history of malignant neoplasm of digestive organs: Secondary | ICD-10-CM | POA: Diagnosis not present

## 2018-10-05 DIAGNOSIS — D509 Iron deficiency anemia, unspecified: Secondary | ICD-10-CM | POA: Diagnosis not present

## 2018-10-05 DIAGNOSIS — D649 Anemia, unspecified: Secondary | ICD-10-CM | POA: Diagnosis not present

## 2018-10-05 DIAGNOSIS — R195 Other fecal abnormalities: Secondary | ICD-10-CM | POA: Diagnosis not present

## 2018-10-05 DIAGNOSIS — K449 Diaphragmatic hernia without obstruction or gangrene: Secondary | ICD-10-CM

## 2018-10-05 DIAGNOSIS — K219 Gastro-esophageal reflux disease without esophagitis: Secondary | ICD-10-CM

## 2018-10-05 DIAGNOSIS — Z8719 Personal history of other diseases of the digestive system: Secondary | ICD-10-CM | POA: Diagnosis not present

## 2018-10-05 LAB — CBC WITH DIFFERENTIAL/PLATELET
Basophils Absolute: 0.1 10*3/uL (ref 0.0–0.1)
Basophils Relative: 0.8 % (ref 0.0–3.0)
Eosinophils Absolute: 0.1 10*3/uL (ref 0.0–0.7)
Eosinophils Relative: 2 % (ref 0.0–5.0)
HCT: 27.9 % — ABNORMAL LOW (ref 36.0–46.0)
Hemoglobin: 9.3 g/dL — ABNORMAL LOW (ref 12.0–15.0)
Lymphocytes Relative: 39.3 % (ref 12.0–46.0)
Lymphs Abs: 2.9 10*3/uL (ref 0.7–4.0)
MCHC: 33.3 g/dL (ref 30.0–36.0)
MCV: 87.1 fl (ref 78.0–100.0)
Monocytes Absolute: 0.5 10*3/uL (ref 0.1–1.0)
Monocytes Relative: 7.2 % (ref 3.0–12.0)
Neutro Abs: 3.7 10*3/uL (ref 1.4–7.7)
Neutrophils Relative %: 50.7 % (ref 43.0–77.0)
Platelets: 326 10*3/uL (ref 150.0–400.0)
RBC: 3.2 Mil/uL — ABNORMAL LOW (ref 3.87–5.11)
RDW: 15.9 % — ABNORMAL HIGH (ref 11.5–15.5)
WBC: 7.3 10*3/uL (ref 4.0–10.5)

## 2018-10-05 LAB — COMPREHENSIVE METABOLIC PANEL
ALT: 14 U/L (ref 0–35)
AST: 14 U/L (ref 0–37)
Albumin: 4.3 g/dL (ref 3.5–5.2)
Alkaline Phosphatase: 70 U/L (ref 39–117)
BUN: 43 mg/dL — ABNORMAL HIGH (ref 6–23)
CO2: 25 mEq/L (ref 19–32)
Calcium: 9.7 mg/dL (ref 8.4–10.5)
Chloride: 106 mEq/L (ref 96–112)
Creatinine, Ser: 1.36 mg/dL — ABNORMAL HIGH (ref 0.40–1.20)
GFR: 37.83 mL/min — ABNORMAL LOW (ref >60.00)
Glucose, Bld: 93 mg/dL (ref 70–99)
Potassium: 4.3 mEq/L (ref 3.5–5.1)
Sodium: 140 mEq/L (ref 135–145)
Total Bilirubin: 0.3 mg/dL (ref 0.2–1.2)
Total Protein: 6.7 g/dL (ref 6.0–8.3)

## 2018-10-12 ENCOUNTER — Other Ambulatory Visit: Payer: Self-pay

## 2018-10-12 ENCOUNTER — Telehealth: Payer: Self-pay | Admitting: Gastroenterology

## 2018-10-12 ENCOUNTER — Other Ambulatory Visit (HOSPITAL_COMMUNITY): Payer: Self-pay | Admitting: *Deleted

## 2018-10-12 DIAGNOSIS — R195 Other fecal abnormalities: Secondary | ICD-10-CM

## 2018-10-12 DIAGNOSIS — D509 Iron deficiency anemia, unspecified: Secondary | ICD-10-CM

## 2018-10-12 DIAGNOSIS — Z8719 Personal history of other diseases of the digestive system: Secondary | ICD-10-CM

## 2018-10-12 DIAGNOSIS — Z800 Family history of malignant neoplasm of digestive organs: Secondary | ICD-10-CM

## 2018-10-12 DIAGNOSIS — K449 Diaphragmatic hernia without obstruction or gangrene: Secondary | ICD-10-CM

## 2018-10-12 NOTE — Telephone Encounter (Signed)
Patient is scheduled for 10/25/18 at 8am. Instructions went over with patient and mailed. Patient voiced understanding.

## 2018-10-12 NOTE — Telephone Encounter (Signed)
I have LMOM for patient to return my call.

## 2018-10-13 ENCOUNTER — Other Ambulatory Visit: Payer: Self-pay

## 2018-10-13 ENCOUNTER — Ambulatory Visit (HOSPITAL_COMMUNITY)
Admission: RE | Admit: 2018-10-13 | Discharge: 2018-10-13 | Disposition: A | Discharge status: Home / Self Care | Payer: Medicare HMO | Source: Ambulatory Visit | Attending: Internal Medicine | Admitting: Internal Medicine

## 2018-10-13 DIAGNOSIS — D649 Anemia, unspecified: Secondary | ICD-10-CM | POA: Diagnosis not present

## 2018-10-13 MED ORDER — SODIUM CHLORIDE 0.9 % IV SOLN
510.0000 mg | INTRAVENOUS | Status: DC
  Administered 2018-10-13: 510 mg via INTRAVENOUS
  Filled 2018-10-13: qty 510

## 2018-10-14 ENCOUNTER — Telehealth: Payer: Self-pay | Admitting: Gastroenterology

## 2018-10-14 NOTE — Telephone Encounter (Signed)
Patient called said that she is having another infusion on June 4 and is also having a Capsul endo on June 9 and from her previous talk with you she understood that she needs to have her endo at lease 1 week away from her infusion. She will need to reschedule her capsule Endo that she has on June 9

## 2018-10-17 NOTE — Telephone Encounter (Signed)
LMOM to notify patient of being rescheduled for a week after infusion. 10/28/2018 at 8:00am.

## 2018-10-19 ENCOUNTER — Other Ambulatory Visit (HOSPITAL_COMMUNITY): Payer: Self-pay | Admitting: *Deleted

## 2018-10-19 DIAGNOSIS — D649 Anemia, unspecified: Secondary | ICD-10-CM | POA: Diagnosis not present

## 2018-10-19 DIAGNOSIS — D509 Iron deficiency anemia, unspecified: Secondary | ICD-10-CM | POA: Diagnosis not present

## 2018-10-20 ENCOUNTER — Other Ambulatory Visit: Payer: Self-pay

## 2018-10-20 ENCOUNTER — Ambulatory Visit (HOSPITAL_COMMUNITY)
Admission: RE | Admit: 2018-10-20 | Discharge: 2018-10-20 | Disposition: A | Discharge status: Home / Self Care | Payer: Medicare HMO | Source: Ambulatory Visit | Attending: Internal Medicine | Admitting: Internal Medicine

## 2018-10-20 DIAGNOSIS — D649 Anemia, unspecified: Secondary | ICD-10-CM | POA: Diagnosis not present

## 2018-10-20 MED ORDER — SODIUM CHLORIDE 0.9 % IV SOLN
510.0000 mg | Freq: Once | INTRAVENOUS | Status: AC
  Administered 2018-10-20: 510 mg via INTRAVENOUS
  Filled 2018-10-20: qty 510

## 2018-10-26 DIAGNOSIS — K922 Gastrointestinal hemorrhage, unspecified: Secondary | ICD-10-CM | POA: Diagnosis not present

## 2018-10-26 DIAGNOSIS — F3341 Major depressive disorder, recurrent, in partial remission: Secondary | ICD-10-CM | POA: Diagnosis not present

## 2018-10-26 DIAGNOSIS — D509 Iron deficiency anemia, unspecified: Secondary | ICD-10-CM | POA: Diagnosis not present

## 2018-10-27 DIAGNOSIS — F3341 Major depressive disorder, recurrent, in partial remission: Secondary | ICD-10-CM | POA: Diagnosis not present

## 2018-10-28 ENCOUNTER — Other Ambulatory Visit: Payer: Self-pay

## 2018-10-28 ENCOUNTER — Encounter (INDEPENDENT_AMBULATORY_CARE_PROVIDER_SITE_OTHER): Payer: Medicare HMO | Admitting: Gastroenterology

## 2018-10-28 ENCOUNTER — Ambulatory Visit: Payer: Medicare HMO | Admitting: Gastroenterology

## 2018-10-28 DIAGNOSIS — D500 Iron deficiency anemia secondary to blood loss (chronic): Secondary | ICD-10-CM | POA: Diagnosis not present

## 2018-10-28 DIAGNOSIS — R195 Other fecal abnormalities: Secondary | ICD-10-CM

## 2018-10-28 NOTE — Progress Notes (Signed)
Patient here for capsule endoscopy. Tolerated procedure. Verbalizes understanding of written and verbal instructions.  Lot # [id number] Capsule ID [id number]

## 2018-11-01 DIAGNOSIS — F3341 Major depressive disorder, recurrent, in partial remission: Secondary | ICD-10-CM | POA: Diagnosis not present

## 2018-11-14 DIAGNOSIS — F3341 Major depressive disorder, recurrent, in partial remission: Secondary | ICD-10-CM | POA: Diagnosis not present

## 2018-11-24 DIAGNOSIS — D649 Anemia, unspecified: Secondary | ICD-10-CM | POA: Diagnosis not present

## 2018-12-02 ENCOUNTER — Telehealth: Payer: Self-pay | Admitting: Gastroenterology

## 2018-12-02 NOTE — Telephone Encounter (Signed)
Please review previous message and advise 

## 2018-12-02 NOTE — Telephone Encounter (Signed)
Pt inquired about results of capsule endo

## 2018-12-05 NOTE — Telephone Encounter (Signed)
Patient called regarding her results I told her that we are still waiting on the Dr. To review them and someone should get back with her.

## 2018-12-05 NOTE — Telephone Encounter (Signed)
Patient called again requesting results; Please advise;

## 2018-12-06 NOTE — Telephone Encounter (Signed)
Bre Please let Wendy Fitzpatrick know that capsule endoscopy was normal.  No active bleeding.  Thx  RG

## 2018-12-06 NOTE — Telephone Encounter (Signed)
Left message for patient to call back  

## 2018-12-07 NOTE — Telephone Encounter (Signed)
Called and spoke with patient-patient informed of result note and MD recommendations; patient is agreeable with plan of care; Patient verbalized understanding of information/instructions;  Patient was advised to call the office at [phone] if questions/concerns arise; 

## 2019-01-31 ENCOUNTER — Other Ambulatory Visit: Payer: Self-pay

## 2019-01-31 MED ORDER — PANTOPRAZOLE SODIUM 40 MG PO TBEC: 40.0000 mg | DELAYED_RELEASE_TABLET | Freq: Every day | ORAL | 11 refills | Status: DC

## 2019-01-31 NOTE — Progress Notes (Signed)
Fax came from [facility] asking for new rx request

## 2019-03-20 DIAGNOSIS — D509 Iron deficiency anemia, unspecified: Secondary | ICD-10-CM | POA: Diagnosis not present

## 2019-03-20 DIAGNOSIS — E1129 Type 2 diabetes mellitus with other diabetic kidney complication: Secondary | ICD-10-CM | POA: Diagnosis not present

## 2019-03-20 DIAGNOSIS — N329 Bladder disorder, unspecified: Secondary | ICD-10-CM | POA: Diagnosis not present

## 2019-03-20 DIAGNOSIS — N1832 Chronic kidney disease, stage 3b: Secondary | ICD-10-CM | POA: Diagnosis not present

## 2019-03-20 DIAGNOSIS — R5381 Other malaise: Secondary | ICD-10-CM | POA: Diagnosis not present

## 2019-04-03 DIAGNOSIS — E1129 Type 2 diabetes mellitus with other diabetic kidney complication: Secondary | ICD-10-CM | POA: Diagnosis not present

## 2019-04-03 DIAGNOSIS — E7849 Other hyperlipidemia: Secondary | ICD-10-CM | POA: Diagnosis not present

## 2019-04-04 DIAGNOSIS — E1129 Type 2 diabetes mellitus with other diabetic kidney complication: Secondary | ICD-10-CM | POA: Diagnosis not present

## 2019-04-04 DIAGNOSIS — E7849 Other hyperlipidemia: Secondary | ICD-10-CM | POA: Diagnosis not present

## 2019-04-06 DIAGNOSIS — I129 Hypertensive chronic kidney disease with stage 1 through stage 4 chronic kidney disease, or unspecified chronic kidney disease: Secondary | ICD-10-CM | POA: Diagnosis not present

## 2019-04-06 DIAGNOSIS — K219 Gastro-esophageal reflux disease without esophagitis: Secondary | ICD-10-CM | POA: Diagnosis not present

## 2019-04-06 DIAGNOSIS — N1831 Chronic kidney disease, stage 3a: Secondary | ICD-10-CM | POA: Diagnosis not present

## 2019-04-06 DIAGNOSIS — E1121 Type 2 diabetes mellitus with diabetic nephropathy: Secondary | ICD-10-CM | POA: Diagnosis not present

## 2019-04-06 DIAGNOSIS — Z0000 Encounter for general adult medical examination without abnormal findings: Secondary | ICD-10-CM | POA: Diagnosis not present

## 2019-04-06 DIAGNOSIS — E1122 Type 2 diabetes mellitus with diabetic chronic kidney disease: Secondary | ICD-10-CM | POA: Diagnosis not present

## 2019-04-06 DIAGNOSIS — E1129 Type 2 diabetes mellitus with other diabetic kidney complication: Secondary | ICD-10-CM | POA: Diagnosis not present

## 2019-04-06 DIAGNOSIS — R82998 Other abnormal findings in urine: Secondary | ICD-10-CM | POA: Diagnosis not present

## 2019-04-06 DIAGNOSIS — I10 Essential (primary) hypertension: Secondary | ICD-10-CM | POA: Diagnosis not present

## 2019-04-06 DIAGNOSIS — N329 Bladder disorder, unspecified: Secondary | ICD-10-CM | POA: Diagnosis not present

## 2019-04-06 DIAGNOSIS — E785 Hyperlipidemia, unspecified: Secondary | ICD-10-CM | POA: Diagnosis not present

## 2019-04-06 DIAGNOSIS — F3341 Major depressive disorder, recurrent, in partial remission: Secondary | ICD-10-CM | POA: Diagnosis not present

## 2019-04-06 DIAGNOSIS — D509 Iron deficiency anemia, unspecified: Secondary | ICD-10-CM | POA: Diagnosis not present

## 2019-04-10 DIAGNOSIS — D509 Iron deficiency anemia, unspecified: Secondary | ICD-10-CM | POA: Diagnosis not present

## 2019-04-17 ENCOUNTER — Other Ambulatory Visit: Payer: Self-pay | Admitting: Gastroenterology

## 2019-04-17 DIAGNOSIS — R1030 Lower abdominal pain, unspecified: Secondary | ICD-10-CM

## 2019-04-17 DIAGNOSIS — D509 Iron deficiency anemia, unspecified: Secondary | ICD-10-CM

## 2019-04-17 DIAGNOSIS — R634 Abnormal weight loss: Secondary | ICD-10-CM

## 2019-04-17 DIAGNOSIS — R195 Other fecal abnormalities: Secondary | ICD-10-CM

## 2019-05-10 DIAGNOSIS — K921 Melena: Secondary | ICD-10-CM | POA: Diagnosis not present

## 2019-05-31 DIAGNOSIS — N816 Rectocele: Secondary | ICD-10-CM | POA: Diagnosis not present

## 2019-05-31 DIAGNOSIS — N819 Female genital prolapse, unspecified: Secondary | ICD-10-CM | POA: Diagnosis not present

## 2019-05-31 DIAGNOSIS — R351 Nocturia: Secondary | ICD-10-CM | POA: Diagnosis not present

## 2019-05-31 DIAGNOSIS — R300 Dysuria: Secondary | ICD-10-CM | POA: Diagnosis not present

## 2019-06-09 DIAGNOSIS — M6281 Muscle weakness (generalized): Secondary | ICD-10-CM | POA: Diagnosis not present

## 2019-06-09 DIAGNOSIS — M62838 Other muscle spasm: Secondary | ICD-10-CM | POA: Diagnosis not present

## 2019-06-09 DIAGNOSIS — N3941 Urge incontinence: Secondary | ICD-10-CM | POA: Diagnosis not present

## 2019-06-09 DIAGNOSIS — R351 Nocturia: Secondary | ICD-10-CM | POA: Diagnosis not present

## 2019-06-09 DIAGNOSIS — R3915 Urgency of urination: Secondary | ICD-10-CM | POA: Diagnosis not present

## 2019-06-09 DIAGNOSIS — N393 Stress incontinence (female) (male): Secondary | ICD-10-CM | POA: Diagnosis not present

## 2019-06-13 DIAGNOSIS — M6281 Muscle weakness (generalized): Secondary | ICD-10-CM | POA: Diagnosis not present

## 2019-06-13 DIAGNOSIS — N3941 Urge incontinence: Secondary | ICD-10-CM | POA: Diagnosis not present

## 2019-06-13 DIAGNOSIS — R351 Nocturia: Secondary | ICD-10-CM | POA: Diagnosis not present

## 2019-06-13 DIAGNOSIS — N393 Stress incontinence (female) (male): Secondary | ICD-10-CM | POA: Diagnosis not present

## 2019-06-13 DIAGNOSIS — R3915 Urgency of urination: Secondary | ICD-10-CM | POA: Diagnosis not present

## 2019-06-13 DIAGNOSIS — M62838 Other muscle spasm: Secondary | ICD-10-CM | POA: Diagnosis not present

## 2019-06-21 DIAGNOSIS — M6281 Muscle weakness (generalized): Secondary | ICD-10-CM | POA: Diagnosis not present

## 2019-06-21 DIAGNOSIS — R3915 Urgency of urination: Secondary | ICD-10-CM | POA: Diagnosis not present

## 2019-06-21 DIAGNOSIS — N393 Stress incontinence (female) (male): Secondary | ICD-10-CM | POA: Diagnosis not present

## 2019-06-21 DIAGNOSIS — N3941 Urge incontinence: Secondary | ICD-10-CM | POA: Diagnosis not present

## 2019-06-21 DIAGNOSIS — M62838 Other muscle spasm: Secondary | ICD-10-CM | POA: Diagnosis not present

## 2019-06-21 DIAGNOSIS — R351 Nocturia: Secondary | ICD-10-CM | POA: Diagnosis not present

## 2019-07-05 DIAGNOSIS — R3915 Urgency of urination: Secondary | ICD-10-CM | POA: Diagnosis not present

## 2019-07-05 DIAGNOSIS — N3941 Urge incontinence: Secondary | ICD-10-CM | POA: Diagnosis not present

## 2019-07-05 DIAGNOSIS — M6281 Muscle weakness (generalized): Secondary | ICD-10-CM | POA: Diagnosis not present

## 2019-07-05 DIAGNOSIS — R351 Nocturia: Secondary | ICD-10-CM | POA: Diagnosis not present

## 2019-07-05 DIAGNOSIS — M62838 Other muscle spasm: Secondary | ICD-10-CM | POA: Diagnosis not present

## 2019-07-05 DIAGNOSIS — N393 Stress incontinence (female) (male): Secondary | ICD-10-CM | POA: Diagnosis not present

## 2019-08-07 DIAGNOSIS — H524 Presbyopia: Secondary | ICD-10-CM | POA: Diagnosis not present

## 2019-08-07 DIAGNOSIS — E119 Type 2 diabetes mellitus without complications: Secondary | ICD-10-CM | POA: Diagnosis not present

## 2019-08-07 DIAGNOSIS — H5213 Myopia, bilateral: Secondary | ICD-10-CM | POA: Diagnosis not present

## 2019-08-07 DIAGNOSIS — H52203 Unspecified astigmatism, bilateral: Secondary | ICD-10-CM | POA: Diagnosis not present

## 2019-08-07 DIAGNOSIS — Z961 Presence of intraocular lens: Secondary | ICD-10-CM | POA: Diagnosis not present

## 2019-09-25 DIAGNOSIS — M7090 Unspecified soft tissue disorder related to use, overuse and pressure of unspecified site: Secondary | ICD-10-CM | POA: Diagnosis not present

## 2019-09-25 DIAGNOSIS — M549 Dorsalgia, unspecified: Secondary | ICD-10-CM | POA: Diagnosis not present

## 2019-10-10 ENCOUNTER — Other Ambulatory Visit: Payer: Self-pay

## 2019-10-10 MED ORDER — PANTOPRAZOLE SODIUM 40 MG PO TBEC: 40.0000 mg | DELAYED_RELEASE_TABLET | Freq: Every day | ORAL | 1 refills | Status: AC

## 2020-02-09 DIAGNOSIS — M25561 Pain in right knee: Secondary | ICD-10-CM | POA: Diagnosis not present

## 2020-02-15 DIAGNOSIS — B029 Zoster without complications: Secondary | ICD-10-CM | POA: Diagnosis not present

## 2020-02-15 DIAGNOSIS — Z0000 Encounter for general adult medical examination without abnormal findings: Secondary | ICD-10-CM | POA: Diagnosis not present

## 2020-02-15 DIAGNOSIS — E1165 Type 2 diabetes mellitus with hyperglycemia: Secondary | ICD-10-CM | POA: Diagnosis not present

## 2020-02-15 DIAGNOSIS — Z23 Encounter for immunization: Secondary | ICD-10-CM | POA: Diagnosis not present

## 2020-02-15 DIAGNOSIS — F419 Anxiety disorder, unspecified: Secondary | ICD-10-CM | POA: Diagnosis not present

## 2020-02-20 DIAGNOSIS — M25561 Pain in right knee: Secondary | ICD-10-CM | POA: Diagnosis not present

## 2020-02-20 DIAGNOSIS — M1711 Unilateral primary osteoarthritis, right knee: Secondary | ICD-10-CM | POA: Diagnosis not present

## 2020-02-20 DIAGNOSIS — Z79899 Other long term (current) drug therapy: Secondary | ICD-10-CM | POA: Diagnosis not present

## 2020-02-22 DIAGNOSIS — I10 Essential (primary) hypertension: Secondary | ICD-10-CM | POA: Diagnosis not present

## 2020-02-22 DIAGNOSIS — M25561 Pain in right knee: Secondary | ICD-10-CM | POA: Diagnosis not present

## 2020-02-22 DIAGNOSIS — E782 Mixed hyperlipidemia: Secondary | ICD-10-CM | POA: Diagnosis not present

## 2020-02-22 DIAGNOSIS — K219 Gastro-esophageal reflux disease without esophagitis: Secondary | ICD-10-CM | POA: Diagnosis not present

## 2020-02-22 DIAGNOSIS — F419 Anxiety disorder, unspecified: Secondary | ICD-10-CM | POA: Diagnosis not present

## 2020-02-29 DIAGNOSIS — E785 Hyperlipidemia, unspecified: Secondary | ICD-10-CM | POA: Diagnosis not present

## 2020-02-29 DIAGNOSIS — M25461 Effusion, right knee: Secondary | ICD-10-CM | POA: Diagnosis not present

## 2020-02-29 DIAGNOSIS — M1711 Unilateral primary osteoarthritis, right knee: Secondary | ICD-10-CM | POA: Diagnosis not present

## 2020-02-29 DIAGNOSIS — Z79899 Other long term (current) drug therapy: Secondary | ICD-10-CM | POA: Diagnosis not present

## 2020-02-29 DIAGNOSIS — M25561 Pain in right knee: Secondary | ICD-10-CM | POA: Diagnosis not present

## 2020-02-29 DIAGNOSIS — I10 Essential (primary) hypertension: Secondary | ICD-10-CM | POA: Diagnosis not present

## 2020-02-29 DIAGNOSIS — E119 Type 2 diabetes mellitus without complications: Secondary | ICD-10-CM | POA: Diagnosis not present

## 2020-03-11 DIAGNOSIS — S83241A Other tear of medial meniscus, current injury, right knee, initial encounter: Secondary | ICD-10-CM | POA: Diagnosis not present

## 2020-03-11 DIAGNOSIS — M25561 Pain in right knee: Secondary | ICD-10-CM | POA: Diagnosis not present

## 2020-03-15 DIAGNOSIS — M25561 Pain in right knee: Secondary | ICD-10-CM | POA: Diagnosis not present

## 2020-04-10 DIAGNOSIS — M1711 Unilateral primary osteoarthritis, right knee: Secondary | ICD-10-CM | POA: Diagnosis not present

## 2020-04-10 DIAGNOSIS — G8918 Other acute postprocedural pain: Secondary | ICD-10-CM | POA: Diagnosis not present

## 2020-04-10 DIAGNOSIS — E785 Hyperlipidemia, unspecified: Secondary | ICD-10-CM | POA: Diagnosis not present

## 2020-04-10 DIAGNOSIS — I129 Hypertensive chronic kidney disease with stage 1 through stage 4 chronic kidney disease, or unspecified chronic kidney disease: Secondary | ICD-10-CM | POA: Diagnosis not present

## 2020-04-10 DIAGNOSIS — Z96651 Presence of right artificial knee joint: Secondary | ICD-10-CM | POA: Diagnosis not present

## 2020-04-10 DIAGNOSIS — N183 Chronic kidney disease, stage 3 unspecified: Secondary | ICD-10-CM | POA: Diagnosis not present

## 2020-04-12 DIAGNOSIS — Z4801 Encounter for change or removal of surgical wound dressing: Secondary | ICD-10-CM | POA: Diagnosis not present

## 2020-04-12 DIAGNOSIS — Z96651 Presence of right artificial knee joint: Secondary | ICD-10-CM | POA: Diagnosis not present

## 2020-04-12 DIAGNOSIS — Z471 Aftercare following joint replacement surgery: Secondary | ICD-10-CM | POA: Diagnosis not present

## 2020-04-12 DIAGNOSIS — Z602 Problems related to living alone: Secondary | ICD-10-CM | POA: Diagnosis not present

## 2020-04-12 DIAGNOSIS — Z791 Long term (current) use of non-steroidal anti-inflammatories (NSAID): Secondary | ICD-10-CM | POA: Diagnosis not present

## 2020-04-15 DIAGNOSIS — Z96651 Presence of right artificial knee joint: Secondary | ICD-10-CM | POA: Diagnosis not present

## 2020-04-15 DIAGNOSIS — Z4801 Encounter for change or removal of surgical wound dressing: Secondary | ICD-10-CM | POA: Diagnosis not present

## 2020-04-15 DIAGNOSIS — Z602 Problems related to living alone: Secondary | ICD-10-CM | POA: Diagnosis not present

## 2020-04-15 DIAGNOSIS — Z791 Long term (current) use of non-steroidal anti-inflammatories (NSAID): Secondary | ICD-10-CM | POA: Diagnosis not present

## 2020-04-15 DIAGNOSIS — Z471 Aftercare following joint replacement surgery: Secondary | ICD-10-CM | POA: Diagnosis not present

## 2020-04-17 DIAGNOSIS — Z4801 Encounter for change or removal of surgical wound dressing: Secondary | ICD-10-CM | POA: Diagnosis not present

## 2020-04-17 DIAGNOSIS — Z791 Long term (current) use of non-steroidal anti-inflammatories (NSAID): Secondary | ICD-10-CM | POA: Diagnosis not present

## 2020-04-17 DIAGNOSIS — Z96651 Presence of right artificial knee joint: Secondary | ICD-10-CM | POA: Diagnosis not present

## 2020-04-17 DIAGNOSIS — Z602 Problems related to living alone: Secondary | ICD-10-CM | POA: Diagnosis not present

## 2020-04-17 DIAGNOSIS — Z471 Aftercare following joint replacement surgery: Secondary | ICD-10-CM | POA: Diagnosis not present

## 2020-04-19 DIAGNOSIS — Z471 Aftercare following joint replacement surgery: Secondary | ICD-10-CM | POA: Diagnosis not present

## 2020-04-19 DIAGNOSIS — Z602 Problems related to living alone: Secondary | ICD-10-CM | POA: Diagnosis not present

## 2020-04-19 DIAGNOSIS — Z791 Long term (current) use of non-steroidal anti-inflammatories (NSAID): Secondary | ICD-10-CM | POA: Diagnosis not present

## 2020-04-19 DIAGNOSIS — Z4801 Encounter for change or removal of surgical wound dressing: Secondary | ICD-10-CM | POA: Diagnosis not present

## 2020-04-19 DIAGNOSIS — Z96651 Presence of right artificial knee joint: Secondary | ICD-10-CM | POA: Diagnosis not present

## 2020-04-22 DIAGNOSIS — Z4801 Encounter for change or removal of surgical wound dressing: Secondary | ICD-10-CM | POA: Diagnosis not present

## 2020-04-22 DIAGNOSIS — Z791 Long term (current) use of non-steroidal anti-inflammatories (NSAID): Secondary | ICD-10-CM | POA: Diagnosis not present

## 2020-04-22 DIAGNOSIS — Z602 Problems related to living alone: Secondary | ICD-10-CM | POA: Diagnosis not present

## 2020-04-22 DIAGNOSIS — Z471 Aftercare following joint replacement surgery: Secondary | ICD-10-CM | POA: Diagnosis not present

## 2020-04-22 DIAGNOSIS — Z96651 Presence of right artificial knee joint: Secondary | ICD-10-CM | POA: Diagnosis not present

## 2020-04-26 DIAGNOSIS — Z791 Long term (current) use of non-steroidal anti-inflammatories (NSAID): Secondary | ICD-10-CM | POA: Diagnosis not present

## 2020-04-26 DIAGNOSIS — Z4801 Encounter for change or removal of surgical wound dressing: Secondary | ICD-10-CM | POA: Diagnosis not present

## 2020-04-26 DIAGNOSIS — Z471 Aftercare following joint replacement surgery: Secondary | ICD-10-CM | POA: Diagnosis not present

## 2020-04-26 DIAGNOSIS — Z96651 Presence of right artificial knee joint: Secondary | ICD-10-CM | POA: Diagnosis not present

## 2020-04-26 DIAGNOSIS — Z602 Problems related to living alone: Secondary | ICD-10-CM | POA: Diagnosis not present

## 2020-05-01 DIAGNOSIS — Z471 Aftercare following joint replacement surgery: Secondary | ICD-10-CM | POA: Diagnosis not present

## 2020-05-01 DIAGNOSIS — Z791 Long term (current) use of non-steroidal anti-inflammatories (NSAID): Secondary | ICD-10-CM | POA: Diagnosis not present

## 2020-05-01 DIAGNOSIS — Z96651 Presence of right artificial knee joint: Secondary | ICD-10-CM | POA: Diagnosis not present

## 2020-05-01 DIAGNOSIS — Z602 Problems related to living alone: Secondary | ICD-10-CM | POA: Diagnosis not present

## 2020-05-01 DIAGNOSIS — Z4801 Encounter for change or removal of surgical wound dressing: Secondary | ICD-10-CM | POA: Diagnosis not present

## 2020-05-06 DIAGNOSIS — Z471 Aftercare following joint replacement surgery: Secondary | ICD-10-CM | POA: Diagnosis not present

## 2020-05-06 DIAGNOSIS — Z4801 Encounter for change or removal of surgical wound dressing: Secondary | ICD-10-CM | POA: Diagnosis not present

## 2020-05-06 DIAGNOSIS — Z96651 Presence of right artificial knee joint: Secondary | ICD-10-CM | POA: Diagnosis not present

## 2020-05-06 DIAGNOSIS — Z791 Long term (current) use of non-steroidal anti-inflammatories (NSAID): Secondary | ICD-10-CM | POA: Diagnosis not present

## 2020-05-06 DIAGNOSIS — Z602 Problems related to living alone: Secondary | ICD-10-CM | POA: Diagnosis not present

## 2020-05-08 DIAGNOSIS — Z791 Long term (current) use of non-steroidal anti-inflammatories (NSAID): Secondary | ICD-10-CM | POA: Diagnosis not present

## 2020-05-08 DIAGNOSIS — Z471 Aftercare following joint replacement surgery: Secondary | ICD-10-CM | POA: Diagnosis not present

## 2020-05-08 DIAGNOSIS — Z4801 Encounter for change or removal of surgical wound dressing: Secondary | ICD-10-CM | POA: Diagnosis not present

## 2020-05-08 DIAGNOSIS — Z96651 Presence of right artificial knee joint: Secondary | ICD-10-CM | POA: Diagnosis not present

## 2020-05-08 DIAGNOSIS — Z602 Problems related to living alone: Secondary | ICD-10-CM | POA: Diagnosis not present

## 2020-05-14 DIAGNOSIS — E119 Type 2 diabetes mellitus without complications: Secondary | ICD-10-CM | POA: Diagnosis not present

## 2020-05-14 DIAGNOSIS — R002 Palpitations: Secondary | ICD-10-CM | POA: Diagnosis not present

## 2020-05-14 DIAGNOSIS — D649 Anemia, unspecified: Secondary | ICD-10-CM | POA: Diagnosis not present

## 2020-05-14 DIAGNOSIS — K449 Diaphragmatic hernia without obstruction or gangrene: Secondary | ICD-10-CM | POA: Diagnosis not present

## 2020-05-14 DIAGNOSIS — I10 Essential (primary) hypertension: Secondary | ICD-10-CM | POA: Diagnosis not present

## 2020-05-14 DIAGNOSIS — R0602 Shortness of breath: Secondary | ICD-10-CM | POA: Diagnosis not present

## 2020-05-14 DIAGNOSIS — R0600 Dyspnea, unspecified: Secondary | ICD-10-CM | POA: Diagnosis not present

## 2020-05-14 DIAGNOSIS — R000 Tachycardia, unspecified: Secondary | ICD-10-CM | POA: Diagnosis not present

## 2020-05-14 DIAGNOSIS — E785 Hyperlipidemia, unspecified: Secondary | ICD-10-CM | POA: Diagnosis not present

## 2020-05-14 DIAGNOSIS — R791 Abnormal coagulation profile: Secondary | ICD-10-CM | POA: Diagnosis not present
